# Patient Record
Sex: Male | Born: 1960
Health system: Southern US, Community
[De-identification: ages and names within clinical notes are randomized; demographics above are authoritative.]

## PROBLEM LIST (undated history)

## (undated) DIAGNOSIS — K219 Gastro-esophageal reflux disease without esophagitis: Secondary | ICD-10-CM

## (undated) DIAGNOSIS — E559 Vitamin D deficiency, unspecified: Secondary | ICD-10-CM

## (undated) DIAGNOSIS — I10 Essential (primary) hypertension: Secondary | ICD-10-CM

## (undated) DIAGNOSIS — E785 Hyperlipidemia, unspecified: Secondary | ICD-10-CM

## (undated) DIAGNOSIS — C9230 Myeloid sarcoma, not having achieved remission: Secondary | ICD-10-CM

## (undated) DIAGNOSIS — G4733 Obstructive sleep apnea (adult) (pediatric): Secondary | ICD-10-CM

## (undated) HISTORY — DX: Myeloid sarcoma, not having achieved remission: C92.30

## (undated) HISTORY — DX: Hyperlipidemia, unspecified: E78.5

## (undated) HISTORY — DX: Essential (primary) hypertension: I10

## (undated) HISTORY — DX: Vitamin D deficiency, unspecified: E55.9

## (undated) HISTORY — DX: Obstructive sleep apnea (adult) (pediatric): G47.33

## (undated) HISTORY — DX: Gastro-esophageal reflux disease without esophagitis: K21.9

---

## 1998-10-03 ENCOUNTER — Ambulatory Visit (HOSPITAL_BASED_OUTPATIENT_CLINIC_OR_DEPARTMENT_OTHER): Admission: RE | Admit: 1998-10-03 | Discharge: 1998-10-03 | Payer: Self-pay | Admitting: Surgery

## 1998-10-17 ENCOUNTER — Ambulatory Visit (HOSPITAL_BASED_OUTPATIENT_CLINIC_OR_DEPARTMENT_OTHER): Admission: RE | Admit: 1998-10-17 | Discharge: 1998-10-17 | Payer: Self-pay | Admitting: Surgery

## 1998-12-08 ENCOUNTER — Ambulatory Visit (HOSPITAL_COMMUNITY): Admission: RE | Admit: 1998-12-08 | Discharge: 1998-12-08 | Payer: Self-pay | Admitting: *Deleted

## 2007-09-07 HISTORY — PX: BACK SURGERY: SHX140

## 2008-08-09 DIAGNOSIS — C44599 Other specified malignant neoplasm of skin of other part of trunk: Secondary | ICD-10-CM | POA: Insufficient documentation

## 2011-02-11 ENCOUNTER — Other Ambulatory Visit (HOSPITAL_BASED_OUTPATIENT_CLINIC_OR_DEPARTMENT_OTHER): Payer: Self-pay | Admitting: Internal Medicine

## 2011-02-11 DIAGNOSIS — M549 Dorsalgia, unspecified: Secondary | ICD-10-CM

## 2011-02-15 ENCOUNTER — Ambulatory Visit (HOSPITAL_BASED_OUTPATIENT_CLINIC_OR_DEPARTMENT_OTHER)
Admission: RE | Admit: 2011-02-15 | Discharge: 2011-02-15 | Disposition: A | Discharge status: Home / Self Care | Payer: Medicaid Other | Source: Ambulatory Visit | Attending: Internal Medicine | Admitting: Internal Medicine

## 2011-02-15 ENCOUNTER — Ambulatory Visit (INDEPENDENT_AMBULATORY_CARE_PROVIDER_SITE_OTHER)
Admission: RE | Admit: 2011-02-15 | Discharge: 2011-02-15 | Disposition: A | Discharge status: Home / Self Care | Payer: Medicaid Other | Source: Ambulatory Visit | Attending: Internal Medicine | Admitting: Internal Medicine

## 2011-02-15 ENCOUNTER — Other Ambulatory Visit (HOSPITAL_BASED_OUTPATIENT_CLINIC_OR_DEPARTMENT_OTHER): Payer: Self-pay | Admitting: Internal Medicine

## 2011-02-15 DIAGNOSIS — M549 Dorsalgia, unspecified: Secondary | ICD-10-CM

## 2011-02-15 DIAGNOSIS — M47814 Spondylosis without myelopathy or radiculopathy, thoracic region: Secondary | ICD-10-CM

## 2011-02-15 DIAGNOSIS — Z859 Personal history of malignant neoplasm, unspecified: Secondary | ICD-10-CM | POA: Insufficient documentation

## 2011-02-24 ENCOUNTER — Encounter: Payer: Self-pay | Admitting: *Deleted

## 2011-02-25 ENCOUNTER — Ambulatory Visit (INDEPENDENT_AMBULATORY_CARE_PROVIDER_SITE_OTHER): Payer: Medicaid Other | Admitting: Pulmonary Disease

## 2011-02-25 ENCOUNTER — Encounter: Payer: Self-pay | Admitting: Pulmonary Disease

## 2011-02-25 VITALS — BP 142/88 | HR 70 | Temp 98.2°F | Ht 69.0 in | Wt 235.2 lb

## 2011-02-25 DIAGNOSIS — G4733 Obstructive sleep apnea (adult) (pediatric): Secondary | ICD-10-CM

## 2011-02-25 NOTE — Progress Notes (Signed)
  Subjective:    Patient ID: James Montgomery, male    DOB: 04/23/1961, 50 y.o.   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  HENT: Negative for nosebleeds, congestion and sneezing.   Eyes: Negative for redness.  Respiratory: Negative for cough, shortness of breath and wheezing.   Cardiovascular: Negative for chest pain and palpitations.  Gastrointestinal: Negative for nausea and abdominal pain.  Genitourinary: Negative for dysuria.  Musculoskeletal: Negative for joint swelling.  Skin: Negative for rash.  Neurological: Negative for headaches.       Objective:   Physical Exam Gen. Pleasant, well-nourished, in no distress, normal affect ENT - no lesions, no post nasal drip, class 2 airway Neck: No JVD, no thyromegaly, no carotid bruits Lungs: no use of accessory muscles, no dullness to percussion, clear without rales or rhonchi  Cardiovascular: Rhythm regular, heart sounds  normal, no murmurs or gallops, no peripheral edema Abdomen: soft and non-tender, no hepatosplenomegaly, BS normal. Musculoskeletal: No deformities, no cyanosis or clubbing Neuro:  alert, non focal         Assessment & Plan:

## 2011-02-25 NOTE — Patient Instructions (Signed)
We discussed signs of obstructive sleep apnea Schedule sleep study

## 2011-02-25 NOTE — Assessment & Plan Note (Signed)
Given excessive daytime somnolence, narrow pharyngeal exam, witnessed apneas & loud snoring, obstructive sleep apnea is very likely & an overnight polysomnogram will be scheduled as a split study. The pathophysiology of obstructive sleep apnea , it's cardiovascular consequences & modes of treatment including CPAP were discused with the patient in detail & they evidenced understanding.  

## 2011-03-08 ENCOUNTER — Ambulatory Visit (HOSPITAL_BASED_OUTPATIENT_CLINIC_OR_DEPARTMENT_OTHER): Payer: Medicaid Other | Attending: Pulmonary Disease

## 2011-03-08 DIAGNOSIS — R5381 Other malaise: Secondary | ICD-10-CM | POA: Insufficient documentation

## 2011-03-08 DIAGNOSIS — R0609 Other forms of dyspnea: Secondary | ICD-10-CM | POA: Insufficient documentation

## 2011-03-08 DIAGNOSIS — R404 Transient alteration of awareness: Secondary | ICD-10-CM | POA: Insufficient documentation

## 2011-03-08 DIAGNOSIS — R0989 Other specified symptoms and signs involving the circulatory and respiratory systems: Secondary | ICD-10-CM | POA: Insufficient documentation

## 2011-03-08 DIAGNOSIS — G4733 Obstructive sleep apnea (adult) (pediatric): Secondary | ICD-10-CM | POA: Insufficient documentation

## 2011-03-08 DIAGNOSIS — R5383 Other fatigue: Secondary | ICD-10-CM | POA: Insufficient documentation

## 2011-03-11 ENCOUNTER — Telehealth: Payer: Self-pay | Admitting: Pulmonary Disease

## 2011-03-11 NOTE — Telephone Encounter (Signed)
Takes 7-10 days for results - will  know by next week

## 2011-03-11 NOTE — Telephone Encounter (Signed)
Pt advised. Emilygrace Grothe, CMA  

## 2011-03-11 NOTE — Telephone Encounter (Signed)
Called and spoke with pt. Pt is requesting sleep study results.  Had sleep study done on Monday 03/08/11.  RA, please advise.  Thanks.

## 2011-03-23 ENCOUNTER — Telehealth: Payer: Self-pay | Admitting: Pulmonary Disease

## 2011-03-23 DIAGNOSIS — R5383 Other fatigue: Secondary | ICD-10-CM

## 2011-03-23 DIAGNOSIS — G4733 Obstructive sleep apnea (adult) (pediatric): Secondary | ICD-10-CM

## 2011-03-23 DIAGNOSIS — R5381 Other malaise: Secondary | ICD-10-CM

## 2011-03-23 DIAGNOSIS — R0609 Other forms of dyspnea: Secondary | ICD-10-CM

## 2011-03-23 DIAGNOSIS — R0989 Other specified symptoms and signs involving the circulatory and respiratory systems: Secondary | ICD-10-CM

## 2011-03-23 DIAGNOSIS — R404 Transient alteration of awareness: Secondary | ICD-10-CM

## 2011-03-23 NOTE — Telephone Encounter (Signed)
lmomtcb x1 

## 2011-03-23 NOTE — Telephone Encounter (Signed)
PL let him know PSG showed moderate obstructive sleep apnea - stopped breathing about 30 times/ hour. CPAP improved these events. Rx has been sent (CPAP 10 cm with large fullf ace mask, humidity, download in 1 month)

## 2011-03-24 NOTE — Procedures (Signed)
NAMENOSSON, WENDER                  ACCOUNT NO.:  [id number]  MEDICAL RECORD NO.:  [id number]          PATIENT TYPE:  OUT  LOCATION:  SLEEP CENTER                 FACILITY:  [facility]  PHYSICIAN:  Oretha Milch, MD      DATE OF BIRTH:  10-Mar-1961  DATE OF STUDY:  03/08/2011                           NOCTURNAL POLYSOMNOGRAM  REFERRING PHYSICIAN:  RAKESH V. ALVA  INDICATION FOR STUDY:  This is loud snoring, excessive daytime fatigue, trouble concentrating, and excessive somnolence in this 50 year old gentleman.  At the time of the study, he weighed 230 pounds with a height of 5 feet 9 inches, BMI of 34, neck size of 17 inches.  EPWORTH SLEEPINESS SCORE:  8.  MEDICATIONS:  Bedtime medications none.  This intervention polysomnogram was performed with sleep technologist in attendance.  EEG, EOG, EMG, EKG, and limb movements were recorded. Sleep stages arousals, limb movements and respiratory data were scored according to criteria laid out by the American Academy of Sleep Medicine.  SLEEP ARCHITECTURE:  Lights out was at 11:08 p.m.  Lights on was at 5:11 a.m.  CPAP was initiated at 1:47 a.m.  Total sleep time was 121 minutes with a sleep period time of 141 minutes and sleep efficiency of 76%. Sleep latency was 18 minutes.  Latency to REM sleep was 104 minutes, and week after sleep onset was 22 minutes.  Sleep stages of the percentage of total sleep time was N1 10%, N2 73%, N3 0%, and REM sleep 16.5% (20 minutes).  Supine sleep accounted for 16 minutes and during the CPAP portion, REM sleep accounted for 36 minutes and this was while laying supine.  Longest period of REM sleep was on CPAP at 4 a.m.  RESPIRATORY DATA:  During the diagnostic portion, there were 20 obstructive apneas, 6 central apneas, 0 mixed apneas, and 19 hypopneas with an apnea-hypopnea index of 22 events per hour, 15 RERAS were noted with an RDI of 30 events per hour.  The lowest saturation was 81%. Due to this degree  of respiratory disturbance, CPAP was initiated at 5 cm and titrated to one level of 10 cm.  At this level for 130 minutes including 27 minutes of REM sleep, no events or desaturations were noted.  This appears to be the optimal level used during the study.  AROUSAL DATA:  During the diagnostic portion, there were 52 arousals with an index of 26 events per hour, of these 32 were spontaneous and the rest were associated with respiratory events, very few arousals were noted during the titration portion.  LIMB MOVEMENT DATA:  During diagnostic portion, the lumbar arousal index was one events per hour.  No limb movements were noted on CPAP.  OXYGEN DATA:  Oxygen saturation data, the lowest desaturation during diagnostic portion was 81% and the desaturation index of 18 events per hour.  No significant desaturations were noted on the optimal level of CPAP.  DISCUSSION:  He was desensitized with a large full-face mask and reported being well rested after the study.  CPAP titration appears optimal with supine REM sleep noted.  CARDIAC DATA:  The low heart rate was 47 beats per minute,  the high heart rate was 93 beats per minute.  No arrhythmias were noted.  MOVEMENT-PARASOMNIA:  none noted  IMPRESSIONS-RECOMMENDATIONS: 1. Moderate obstructive sleep apnea with hypopneas causing sleep     fragmentation and oxygen desaturation. 2. This was corrected by continuous positive airway pressure of 10 cm     of the full face mask. 3. No evidence of cardiac arrhythmias, limb movements, or behavioral     disturbance during sleep.  RECOMMENDATIONS: 1. The treatment options for this degree of sleep disordered breathing     include weight loss and CPAP therapy.  CPAP can be initiated at 10     cm with a medium full-face mask and compliance should be monitored     at this level. 2. He should be cautioned against medications with sedative side     effects. 3. He should be advised against driving when  sleepy.     Oretha Milch, MD Electronically Signed    RVA/MEDQ  D:  03/23/2011 12:26:43  T:  03/24/2011 01:21:16  Job:  [id number]

## 2011-04-02 NOTE — Telephone Encounter (Signed)
I informed pt of RA's findings and recommendations. Pt verbalized understanding  

## 2011-04-29 ENCOUNTER — Telehealth: Payer: Self-pay | Admitting: Pulmonary Disease

## 2011-04-29 DIAGNOSIS — G4733 Obstructive sleep apnea (adult) (pediatric): Secondary | ICD-10-CM

## 2011-04-29 NOTE — Telephone Encounter (Signed)
downlaod on 10 cm  - poor compliance - he needs to increase to at least 6h every night CPAP doing its job of cuting out events

## 2011-05-05 NOTE — Telephone Encounter (Signed)
Pt informed and c/o mask is uncomfortable and has adjusted same. I advised pt that RA is out of the office this week and he states he is okay with waiting until he returns for his recs.

## 2011-05-12 NOTE — Telephone Encounter (Signed)
Pl have him d/w DME about changing mask - we will write rx as needed

## 2011-05-17 ENCOUNTER — Encounter: Payer: Self-pay | Admitting: Pulmonary Disease

## 2011-05-18 NOTE — Telephone Encounter (Signed)
Order placed to [facility]. 

## 2011-06-02 ENCOUNTER — Telehealth: Payer: Self-pay | Admitting: Pulmonary Disease

## 2011-06-02 NOTE — Telephone Encounter (Signed)
Compliance better (avg 4h) but he is still not using several nights ! Please use every night CPAP very effective when used

## 2011-06-08 ENCOUNTER — Encounter: Payer: Self-pay | Admitting: Pulmonary Disease

## 2011-06-08 NOTE — Telephone Encounter (Signed)
lmomtcb x 1. James Montgomery W. Darrah Dredge, CMA  

## 2011-06-08 NOTE — Telephone Encounter (Signed)
Returning Jessica's call can be reached at [phone].Raylene Everts

## 2011-06-08 NOTE — Telephone Encounter (Signed)
lmomtcb x 2  

## 2011-07-04 ENCOUNTER — Telehealth: Payer: Self-pay | Admitting: Pulmonary Disease

## 2011-07-04 DIAGNOSIS — G4733 Obstructive sleep apnea (adult) (pediatric): Secondary | ICD-10-CM

## 2011-07-04 NOTE — Telephone Encounter (Signed)
Download reviewed 10/12 >> his usage remains poor unfortnately if his insurance company decided to t ake his CPAP away, I will not be able to help him

## 2011-07-06 NOTE — Telephone Encounter (Signed)
lmomcb x 3.

## 2011-07-06 NOTE — Telephone Encounter (Signed)
Pl have him contact [phone] (sleep lab) & make appt for mask desensitization visit - no charge

## 2011-07-06 NOTE — Telephone Encounter (Signed)
lmomtcb 1

## 2011-07-06 NOTE — Telephone Encounter (Signed)
I informed pt of RA's findings and recommendations. Pt c/o mask is not comfortable and that is why he is not able to use machine every night. States mask "makes loud noise that wakes him up" and "does not get tight enough". Pt states he is trying to save money to afford new mask. Dr. Vassie Loll please advise. Thanks.

## 2011-07-06 NOTE — Telephone Encounter (Signed)
Called cell phone # and spoke with pt. I informed pt of RA's findings and recommendations. Pt verbalized understanding

## 2011-07-09 ENCOUNTER — Other Ambulatory Visit (HOSPITAL_BASED_OUTPATIENT_CLINIC_OR_DEPARTMENT_OTHER): Payer: Medicaid Other

## 2011-07-09 NOTE — Telephone Encounter (Signed)
Order placed. And pt aware of RA's recs.

## 2011-07-27 ENCOUNTER — Ambulatory Visit (HOSPITAL_BASED_OUTPATIENT_CLINIC_OR_DEPARTMENT_OTHER): Payer: Medicaid Other | Attending: Pulmonary Disease

## 2011-07-27 DIAGNOSIS — G4733 Obstructive sleep apnea (adult) (pediatric): Secondary | ICD-10-CM

## 2012-11-16 ENCOUNTER — Other Ambulatory Visit: Payer: Self-pay | Admitting: Gastroenterology

## 2015-03-06 ENCOUNTER — Encounter: Payer: Self-pay | Admitting: Pulmonary Disease

## 2015-03-06 ENCOUNTER — Ambulatory Visit (INDEPENDENT_AMBULATORY_CARE_PROVIDER_SITE_OTHER): Payer: 59 | Admitting: Pulmonary Disease

## 2015-03-06 ENCOUNTER — Encounter (INDEPENDENT_AMBULATORY_CARE_PROVIDER_SITE_OTHER): Payer: Self-pay

## 2015-03-06 VITALS — BP 142/70 | HR 82 | Ht 70.0 in | Wt 245.0 lb

## 2015-03-06 DIAGNOSIS — G4733 Obstructive sleep apnea (adult) (pediatric): Secondary | ICD-10-CM | POA: Diagnosis not present

## 2015-03-06 NOTE — Progress Notes (Signed)
Chief Complaint  Patient presents with  . 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 Epworth Score: 13    History of Present Illness: James Montgomery is a 54 y.o. male for evaluation of sleep problems.  He was previously seen by Dr. Elsworth Soho.  He was found to have moderate sleep apnea on sleep study in 2012.  He was not able to tolerate CPAP mask, and has not been on therapy since then.  His PCP was concerned about his blood pressure control.  He also has more trouble with his sleep.  He snores, and has been told he stops breathing while asleep.  He wakes up in the middle of the night, and has a hard time falling back to sleep.  His mouth gets dry at night.  He goes to sleep at 1030 pm.  He falls asleep after 10.  He wakes up some times to use the bathroom.  He gets out of bed at 4 am to exercise.  He feels tired in the morning, and would stay in bed longer if he could.  He takes naps on weekends when he can.  He denies morning headache.  He does not use anything to help him fall sleep or stay awake.  He denies sleep walking, sleep talking, bruxism, or nightmares.  There is no history of restless legs.  He denies sleep hallucinations, sleep paralysis, or cataplexy.  The Epworth score is 13 out of 24.  Tests: PSG 03/08/11 >> AHI 22, SaO2 low 81%  Ryson Bacha  has a past medical history of HTN (hypertension); Hyperlipidemia; Myeloid sarcoma; Vitamin D deficiency; GERD (gastroesophageal reflux disease); and OSA (obstructive sleep apnea).  Tommie Bohlken  has past surgical history that includes Back surgery (09/2007).  Prior to Admission medications   Medication Sig Start Date End Date Taking? Authorizing Provider  amlodipine-olmesartan (AZOR) 10-20 MG per tablet Take 1 tablet by mouth daily.     Yes Historical Provider, MD  cholecalciferol (VITAMIN D) 1000 UNITS tablet Take 1,000 Units by mouth daily.   Yes Historical Provider, MD  Cyanocobalamin (VITAMIN B 12 PO) Take by mouth once a week.     Yes  Historical Provider, MD  Multiple Vitamin (MULTIVITAMIN) tablet Take 1 tablet by mouth daily.     Yes Historical Provider, MD    No Known Allergies  His family history is not on file.  He  reports that he has never smoked. He has never used smokeless tobacco. He reports that he drinks alcohol. He reports that he does not use illicit drugs.  Review of Systems  Constitutional: Negative for fever and unexpected weight change.  HENT: Negative for congestion, dental problem, ear pain, nosebleeds, postnasal drip, rhinorrhea, sinus pressure, sneezing, sore throat and trouble swallowing.   Eyes: Negative for redness and itching.  Respiratory: Negative for cough, chest tightness, shortness of breath and wheezing.   Cardiovascular: Negative for palpitations and leg swelling.  Gastrointestinal: Negative for nausea and vomiting.       Acid heartburn  Genitourinary: Negative for dysuria.  Musculoskeletal: Negative for joint swelling.  Skin: Negative for rash.  Neurological: Negative for headaches.  Hematological: Does not bruise/bleed easily.  Psychiatric/Behavioral: Negative for dysphoric mood. The patient is not nervous/anxious.    Physical Exam: BP 142/70 mmHg  Pulse 82  Ht 5\' 10"  (1.778 m)  Wt 245 lb (111.131 kg)  BMI 35.15 kg/m2  SpO2 97%  General - No distress ENT - No sinus tenderness, no oral exudate, no  LAN, no thyromegaly, TM clear, pupils equal/reactive, MP 4 Cardiac - s1s2 regular, no murmur, pulses symmetric Chest - No wheeze/rales/dullness, good air entry, normal respiratory excursion Back - No focal tenderness Abd - Soft, non-tender, no organomegaly, + bowel sounds Ext - No edema Neuro - Normal strength, cranial nerves intact Skin - No rashes Psych - Normal mood, and behavior  Discussion: He has snoring, sleep disruption, witnessed apnea and daytime sleepiness.  He has hx of HTN on multiple medications, and prior hx of sleep apnea.  His BMI is > 35.  I am concerned he  could have sleep apnea.  We discussed how sleep apnea can affect various health problems including risks for hypertension, cardiovascular disease, and diabetes.  We also discussed how sleep disruption can increase risks for accident, such as while driving.  Weight loss as a means of improving sleep apnea was also reviewed.  Additional treatment options discussed were CPAP therapy, oral appliance, and surgical intervention.   Assessment/plan:  Obstructive sleep apnea. Plan: - will arrange for home sleep study pending insurance approval  Obesity. Plan: - discussed importance of weight loss   Chesley Mires, M.D. Pager [phone]

## 2015-03-06 NOTE — Progress Notes (Deleted)
   Subjective:    Patient ID: James Montgomery, male    DOB: 07-Feb-1961, 54 y.o.   MRN: [id number]  HPI    Review of Systems  Constitutional: Negative for fever and unexpected weight change.  HENT: Negative for congestion, dental problem, ear pain, nosebleeds, postnasal drip, rhinorrhea, sinus pressure, sneezing, sore throat and trouble swallowing.   Eyes: Negative for redness and itching.  Respiratory: Negative for cough, chest tightness, shortness of breath and wheezing.   Cardiovascular: Negative for palpitations and leg swelling.  Gastrointestinal: Negative for nausea and vomiting.       Acid heartburn  Genitourinary: Negative for dysuria.  Musculoskeletal: Negative for joint swelling.  Skin: Negative for rash.  Neurological: Negative for headaches.  Hematological: Does not bruise/bleed easily.  Psychiatric/Behavioral: Negative for dysphoric mood. The patient is not nervous/anxious.        Objective:   Physical Exam        Assessment & Plan:

## 2015-03-06 NOTE — Patient Instructions (Signed)
Will arrange for home sleep study Will call to arrange for follow up after sleep study reviewed  

## 2015-03-24 DIAGNOSIS — G4730 Sleep apnea, unspecified: Secondary | ICD-10-CM | POA: Diagnosis not present

## 2015-03-26 DIAGNOSIS — G4730 Sleep apnea, unspecified: Secondary | ICD-10-CM | POA: Diagnosis not present

## 2015-03-27 ENCOUNTER — Telehealth: Payer: Self-pay | Admitting: Pulmonary Disease

## 2015-03-27 DIAGNOSIS — G4733 Obstructive sleep apnea (adult) (pediatric): Secondary | ICD-10-CM

## 2015-03-27 NOTE — Telephone Encounter (Signed)
HST 03/24/15 >> AHI 18.5, SaO2 low is 76%.  Will have my nurse inform pt that sleep study shows moderate sleep apnea.  Options are 1) CPAP now, 2) ROV first.  If He is agreeable to CPAP, then please send order for auto CPAP range 5 to 15 cm H2O with heated humidity and mask of choice.  Have download sent 1 month after starting CPAP and set up ROV 2 months after starting CPAP.

## 2015-03-31 ENCOUNTER — Other Ambulatory Visit: Payer: Self-pay | Admitting: *Deleted

## 2015-03-31 DIAGNOSIS — G4733 Obstructive sleep apnea (adult) (pediatric): Secondary | ICD-10-CM

## 2015-03-31 NOTE — Telephone Encounter (Signed)
LMTCB x 1 with wife for pt to return call

## 2015-04-09 NOTE — Telephone Encounter (Signed)
Results have been explained to patient, pt expressed understanding. Order placed for CPAP start. Recall entered for 2 month ROV.  Nothing further needed.

## 2015-05-30 ENCOUNTER — Telehealth: Payer: Self-pay | Admitting: Pulmonary Disease

## 2015-05-30 NOTE — Telephone Encounter (Signed)
lmtcb x1 for pt. 

## 2015-06-02 NOTE — Telephone Encounter (Signed)
lmtcb

## 2015-06-02 NOTE — Telephone Encounter (Signed)
Spoke with pt. Pt has been scheduled to see TP on 06/09/15 at 12pm. Nothing further was needed.

## 2015-06-02 NOTE — Telephone Encounter (Signed)
[phone], pt cb

## 2015-06-02 NOTE — Telephone Encounter (Signed)
Spoke with pt, states he needs to see VS.  Pt was to follow up 2 mos after starting cpap.  VS has no openings until December. VS ok to double book, or can he follow up with TP?  Thanks!

## 2015-06-02 NOTE — Telephone Encounter (Signed)
lmtcb x2 for pt. 

## 2015-06-02 NOTE — Telephone Encounter (Signed)
Can schedule with Tammy Parrett. 

## 2015-06-09 ENCOUNTER — Ambulatory Visit (INDEPENDENT_AMBULATORY_CARE_PROVIDER_SITE_OTHER): Payer: 59 | Admitting: Adult Health

## 2015-06-09 ENCOUNTER — Encounter: Payer: Self-pay | Admitting: Adult Health

## 2015-06-09 VITALS — BP 158/98 | HR 62 | Temp 97.6°F | Ht 69.0 in | Wt 237.0 lb

## 2015-06-09 DIAGNOSIS — G4733 Obstructive sleep apnea (adult) (pediatric): Secondary | ICD-10-CM | POA: Diagnosis not present

## 2015-06-09 DIAGNOSIS — Z23 Encounter for immunization: Secondary | ICD-10-CM | POA: Diagnosis not present

## 2015-06-09 NOTE — Patient Instructions (Signed)
We are looking into why insurance is not covering CPAP .  Want you to begin CPAP At bedtime   Goal is at least 6hrs each night.  Work on weight loss.  Do not drive if sleepy Download in 1 month .  follow up Dr. Halford Chessman  Or Zephan Beauchaine in 2 months and As needed

## 2015-06-09 NOTE — Assessment & Plan Note (Addendum)
Moderate sleep apnea Will need to begin nocturnal C Pap  Plan We are looking into why insurance is not covering CPAP .  Want you to begin CPAP At bedtime   Goal is at least 6hrs each night.  Work on weight loss.  Do not drive if sleepy Download in 1 month .  follow up Dr. Halford Chessman  Or Ayaat Jansma in 2 months and As needed    Appeal sent to his insurance company Pt is aware

## 2015-06-09 NOTE — Progress Notes (Signed)
Reviewed and agree with assessment/plan. 

## 2015-06-09 NOTE — Progress Notes (Signed)
   Subjective:    Patient ID: James Montgomery, male    DOB: May 18, 1961, 54 y.o.   MRN: [id number]  HPI 54 yo male , obese male with sleep apnea  06/09/2015 Follow up : Sleep apnea Patient returns for a follow-up for sleep apnea. Patient was recently seen in the office for sleep issues. He been diagnosed with sleep apnea on a sleep study in 2012 but was unable to tolerate his C Pap mask. He was set up for a home sleep study on July 18 that showed an AHI 18.5, with SaO2 low at 76%. Patient was recommended to start on C Pap was in office visit. 2 months later. Unfortunately, his C Pap was not started. Patient is unsure why his insurance would not cover. We have contacted his insurance to do an inquiry to look into his coverage. Patient does complain of daytime sleepiness and disrupted sleep and loud snoring.  We talked with insurance and found out his insurance denied a new machine and we had to do an appeal to his insurance company . Pt is aware .    Review of Systems Constitutional:   No  weight loss, night sweats,  Fevers, chills, + fatigue, or  lassitude.  HEENT:   No headaches,  Difficulty swallowing,  Tooth/dental problems, or  Sore throat,                No sneezing, itching, ear ache, nasal congestion, post nasal drip,   CV:  No chest pain,  Orthopnea, PND, swelling in lower extremities, anasarca, dizziness, palpitations, syncope.   GI  No heartburn, indigestion, abdominal pain, nausea, vomiting, diarrhea, change in bowel habits, loss of appetite, bloody stools.   Resp: No shortness of breath with exertion or at rest.  No excess mucus, no productive cough,  No non-productive cough,  No coughing up of blood.  No change in color of mucus.  No wheezing.  No chest wall deformity  Skin: no rash or lesions.  GU: no dysuria, change in color of urine, no urgency or frequency.  No flank pain, no hematuria   MS:  No joint pain or swelling.  No decreased range of motion.  No back  pain.  Psych:  No change in mood or affect. No depression or anxiety.  No memory loss.         Objective:   Physical Exam GEN: A/Ox3; pleasant , NAD, obese  HEENT:  [place]/AT,  EACs-clear, TMs-wnl, NOSE-clear, THROAT-clear, no lesions, no postnasal drip or exudate noted. Class 2-3 airway  NECK:  Supple w/ fair ROM; no JVD; normal carotid impulses w/o bruits; no thyromegaly or nodules palpated; no lymphadenopathy.  RESP  Clear  P & A; w/o, wheezes/ rales/ or rhonchi.no accessory muscle use, no dullness to percussion  CARD:  RRR, no m/r/g  , no peripheral edema, pulses intact, no cyanosis or clubbing.  GI:   Soft & nt; nml bowel sounds; no organomegaly or masses detected.  Musco: Warm bil, no deformities or joint swelling noted.   Neuro: alert, no focal deficits noted.    Skin: Warm, no lesions or rashes         Assessment & Plan:

## 2015-06-16 ENCOUNTER — Telehealth: Payer: Self-pay | Admitting: Pulmonary Disease

## 2015-06-16 DIAGNOSIS — Z9989 Dependence on other enabling machines and devices: Secondary | ICD-10-CM

## 2015-06-16 DIAGNOSIS — G4733 Obstructive sleep apnea (adult) (pediatric): Secondary | ICD-10-CM

## 2015-06-16 NOTE — Telephone Encounter (Signed)
Called and spoke to [place]. Peer to peer is needed through [facility] to approve pt's CPAP. Pt was last seen by TP on 10.3.2016. Peer to Peer phone number: [phone].   Dr. Halford Chessman please advise. Thanks.

## 2015-06-18 NOTE — Telephone Encounter (Signed)
Dr. Halford Chessman, has this been done?  Please advise.

## 2015-06-18 NOTE — Telephone Encounter (Signed)
Patient Returned call  [phone]

## 2015-06-19 NOTE — Telephone Encounter (Signed)
Called # provided for peer to peer and spoke with Pam. Was advised this denial is from august and we are not able to do a peer to peer now. A letter of medical necessity will have to be done and faxed to [phone]. This letter will need to include reason for therapy being restarted, what happened to pt prior CPAP equipment as we requested a new machine (is it repairable or broken). We also need to submit clinical documentation including recent sleep study done. Please advise Dr. Halford Chessman thanks

## 2015-06-19 NOTE — Telephone Encounter (Signed)
I have tried calling for peer to peer >> left on hold endlessly.  Please call [employer] and set up date/time that I can actually speak with some one to do peer to peer.

## 2015-06-19 NOTE — Telephone Encounter (Signed)
atc pt X2, fast busy signal.  Wcb.  Dr. Halford Chessman please advise if peer-to-peer has been done.  Thanks!

## 2015-06-20 ENCOUNTER — Encounter: Payer: Self-pay | Admitting: Pulmonary Disease

## 2015-06-20 NOTE — Telephone Encounter (Signed)
Patient notified. Pt states he will call back to schedule appt after CPAP supplies/ CPAP has been set up Note faxed to pt's insurance co. Nothing further needed.

## 2015-06-20 NOTE — Telephone Encounter (Signed)
In review of records, Mr. James Montgomery was set up with CPAP in 2012.  He still has machine from 2012.  His main issue is with mask fit.  He is not sure if machine from 2012 is still working, but believes it will work.  I have advised him that I will send an order through [facility] to get him set up with new CPAP supplies, and will have his DME assess whether his CPAP device from 2012 is functional.  If it is not functional, will then contact his insurance to arrange for new CPAP machine.  I have written a letter that can be sent to his insurance detailing these findings.  This is on my desk in my office.  Will have my nurse schedule ROV in 2 months.

## 2015-08-12 ENCOUNTER — Ambulatory Visit: Payer: 59 | Admitting: Adult Health

## 2015-09-04 ENCOUNTER — Telehealth: Payer: Self-pay | Admitting: Pulmonary Disease

## 2015-09-04 NOTE — Telephone Encounter (Signed)
Patient Returned call  [phone]

## 2015-09-04 NOTE — Telephone Encounter (Signed)
Called and spoke with pt. He stated that he spoke with insurance and that he is eligible for a new CPAP machine and that he took his machine to [facility]. He states that [facility] told him that his machine was not functional. I explained to him that [facility] did not send a order for a new CPAP but only for supplies. VS also stated in the phone note from that if his machine was not functional then he would see about ordering a new machine. I explained to the patient that I would need to call [facility] to discuss if machine was functional. He voiced understanding and had no further questions.    Called and spoke with Lilia Pro. Pt called into [facility] on 08/07/15 and [facility] troubleshooted the CPAP and stated that the machine was functional. He stated at that time the physician had order new CPAP supplies. He then set up an appointment with [facility] for 08/13/15 at 10am and he no showed appointment. She also stated that he was not eligible for a new CPAP due to insurance.   Called and spoke with pt. Explained the above to the pt that the machine was functional and that he was not eligible for a new machine. He stated he would contact [facility] and discuss his CPAP with them. He voiced understanding and had no further questions.

## 2015-09-04 NOTE — Telephone Encounter (Signed)
ATC pt received fast busy signal. [facility]

## 2015-10-09 ENCOUNTER — Telehealth: Payer: Self-pay | Admitting: Pulmonary Disease

## 2015-10-09 DIAGNOSIS — G4733 Obstructive sleep apnea (adult) (pediatric): Secondary | ICD-10-CM

## 2015-10-09 NOTE — Telephone Encounter (Signed)
Patient says that he called [facility] and they told him that they have not received a prescription from our office for CPAP machine.  Order was sent from our office on 06/20/15:  Pt has used [facility] for CPAP supplies. He has CPAP from 2012 that he will start using again. Please have Gravette set this to auto CPAP with range of 5 to 15 cm H2O. Have download sent 1 month after restarting CPAP. Please have Freedom check to make sure CPAP device is working. He will also need new CPAP supplies.       Patient says that [facility] told him that they did not have an order from our office. Sent message to [place].  Awaiting response from [facility].

## 2015-10-09 NOTE — Telephone Encounter (Signed)
Needing asap say [facility] doesn't have anything on him please advise.Hillery Hunter

## 2015-10-10 NOTE — Telephone Encounter (Signed)
Pt returning call and I let him know that order was being placed and he seemed satisfied.James Montgomery

## 2015-10-10 NOTE — Telephone Encounter (Signed)
Spoke with Jeneen Rinks at [facility], states that the "itemization" on the order needs to state that pt needs the following; cpap machine, humidifier, full face mask, headgear, heated tubing, and filters.  Itemized order placed.   lmtcb X1 to make pt aware that order is being placed.

## 2015-10-10 NOTE — Telephone Encounter (Signed)
Pt calling again about these orders [facility] was on the line w/him [name] is saying the orders had expired and they are needing new order sent to the [facility] # [phone] ext [phone] The fax # is [phone] stating that the list has to be itemized listing each item.James Montgomery

## 2015-10-21 ENCOUNTER — Other Ambulatory Visit: Payer: Self-pay | Admitting: Gastroenterology

## 2015-10-21 DIAGNOSIS — R110 Nausea: Secondary | ICD-10-CM

## 2015-10-21 DIAGNOSIS — R1011 Right upper quadrant pain: Secondary | ICD-10-CM

## 2015-10-22 ENCOUNTER — Telehealth: Payer: Self-pay | Admitting: Pulmonary Disease

## 2015-10-22 ENCOUNTER — Ambulatory Visit (HOSPITAL_COMMUNITY)
Admission: RE | Admit: 2015-10-22 | Discharge: 2015-10-22 | Disposition: A | Discharge status: Home / Self Care | Payer: BLUE CROSS/BLUE SHIELD | Source: Ambulatory Visit | Attending: Gastroenterology | Admitting: Gastroenterology

## 2015-10-22 DIAGNOSIS — R1011 Right upper quadrant pain: Secondary | ICD-10-CM

## 2015-10-22 DIAGNOSIS — R935 Abnormal findings on diagnostic imaging of other abdominal regions, including retroperitoneum: Secondary | ICD-10-CM | POA: Diagnosis not present

## 2015-10-22 DIAGNOSIS — K7689 Other specified diseases of liver: Secondary | ICD-10-CM | POA: Insufficient documentation

## 2015-10-22 DIAGNOSIS — R109 Unspecified abdominal pain: Secondary | ICD-10-CM | POA: Diagnosis not present

## 2015-10-22 DIAGNOSIS — R110 Nausea: Secondary | ICD-10-CM

## 2015-10-22 DIAGNOSIS — D7389 Other diseases of spleen: Secondary | ICD-10-CM | POA: Diagnosis not present

## 2015-10-22 NOTE — Telephone Encounter (Signed)
Called and spoke with pt. Pt states that has not received his new cpap or supplies. I explained to him that the order was placed on 10/10/15 and that [facility] was closed at this time, but would call tomorrow morning to check on the status of the order. Pt voiced understanding and had no further questions. Will hold message in Triage

## 2015-10-23 NOTE — Telephone Encounter (Signed)
Called and spoke with Tasha with [facility]. She states that [facility] has received order from 10/10/15. She states she has [employer] and medicaid as his insurance companies and she explained that both were coming up inactive. I informed her that we have [employer] as his primary insurance. She verified that [employer] was active and states she is placing the order for the CPAP supplies and machine. I explained that I would contact the pt to inform him of insurance situation and order status.   Called and spoke with the patient. I informed him of the above. He stated that they never told him that they needed his current insurance and that he assumed that we updated it with [facility] for him. I explained that I updated it today with [facility] and that he should be receiving a call for [facility]. I did inform him if he has anymore issues with his CPAP to please call our office. He voiced understanding and had no further questions.  Nothing further needed.

## 2015-10-29 ENCOUNTER — Ambulatory Visit (HOSPITAL_COMMUNITY)
Admission: RE | Admit: 2015-10-29 | Discharge: 2015-10-29 | Disposition: A | Discharge status: Home / Self Care | Payer: BLUE CROSS/BLUE SHIELD | Source: Ambulatory Visit | Attending: Gastroenterology | Admitting: Gastroenterology

## 2015-10-29 DIAGNOSIS — R110 Nausea: Secondary | ICD-10-CM | POA: Diagnosis not present

## 2015-10-29 DIAGNOSIS — R1011 Right upper quadrant pain: Secondary | ICD-10-CM | POA: Insufficient documentation

## 2015-10-29 MED ORDER — SINCALIDE 5 MCG IJ SOLR: 2.0000 ug | Freq: Once | INTRAMUSCULAR | Status: DC

## 2015-10-29 MED ORDER — SINCALIDE 5 MCG IJ SOLR: 0.0200 ug/kg | Freq: Once | INTRAMUSCULAR | Status: DC

## 2015-10-29 MED ORDER — TECHNETIUM TC 99M MEBROFENIN IV KIT
5.5000 | PACK | Freq: Once | INTRAVENOUS | Status: AC | PRN
  Administered 2015-10-29: 5.5 via INTRAVENOUS

## 2015-11-12 ENCOUNTER — Encounter: Payer: Self-pay | Admitting: Internal Medicine

## 2015-11-12 ENCOUNTER — Ambulatory Visit: Payer: Self-pay | Admitting: General Surgery

## 2015-11-12 NOTE — H&P (Signed)
History of Present Illness Geoffery Spruce MD; 11/12/2015 3:09 PM) Patient words: New-gallbladder.  The patient is a 55 year old male who presents with non-malignant abdominal pain. Referred by Lavena Bullion, MD. He has had epigastric pain for most of the last year. He has had an extensive workup including empiric PPI, upper endoscopy, lower endoscopy although which were negative. On HIDA scan he had an EF of 10%. His pain does not seem to radiate. It is near constant. It is not associated with food. It is not worse with movement. It has occasionally been associated with nausea and a postprandial time.   Other Problems Malachi Bonds, CMA; 11/12/2015 2:10 PM) Gastroesophageal Reflux Disease High blood pressure  Diagnostic Studies History Malachi Bonds, CMA; 11/12/2015 2:10 PM) Colonoscopy 1-5 years ago  Allergies Malachi Bonds, CMA; 11/12/2015 2:11 PM) No Known Drug Allergies03/04/2016  Medication History Malachi Bonds, CMA; 11/12/2015 2:11 PM) Lisinopril-Hydrochlorothiazide (20-25MG  Tablet, Oral) Active. AmLODIPine Besylate (10MG  Tablet, Oral) Active. Medications Reconciled  Social History Malachi Bonds, CMA; 11/12/2015 2:10 PM) Alcohol use Occasional alcohol use. Caffeine use Tea. No drug use Tobacco use Never smoker.  Family History Malachi Bonds, CMA; 11/12/2015 2:10 PM) First Degree Relatives No pertinent family history    Review of Systems Malachi Bonds CMA; 11/12/2015 2:10 PM) General Not Present- Appetite Loss, Chills, Fatigue, Fever, Night Sweats, Weight Gain and Weight Loss. HEENT Not Present- Earache, Hearing Loss, Hoarseness, Nose Bleed, Oral Ulcers, Ringing in the Ears, Seasonal Allergies, Sinus Pain, Sore Throat, Visual Disturbances, Wears glasses/contact lenses and Yellow Eyes. Respiratory Not Present- Bloody sputum, Chronic Cough, Difficulty Breathing, Snoring and Wheezing. Breast Not Present- Breast Mass, Breast Pain, Nipple Discharge and Skin  Changes. Cardiovascular Not Present- Chest Pain, Difficulty Breathing Lying Down, Leg Cramps, Palpitations, Rapid Heart Rate, Shortness of Breath and Swelling of Extremities. Gastrointestinal Present- Abdominal Pain. Not Present- Bloating, Bloody Stool, Change in Bowel Habits, Chronic diarrhea, Constipation, Difficulty Swallowing, Excessive gas, Gets full quickly at meals, Hemorrhoids, Indigestion, Nausea, Rectal Pain and Vomiting. Male Genitourinary Not Present- Blood in Urine, Change in Urinary Stream, Frequency, Impotence, Nocturia, Painful Urination, Urgency and Urine Leakage. Musculoskeletal Not Present- Back Pain, Joint Pain, Joint Stiffness, Muscle Pain, Muscle Weakness and Swelling of Extremities. Neurological Not Present- Decreased Memory, Fainting, Headaches, Numbness, Seizures, Tingling, Tremor, Trouble walking and Weakness. Psychiatric Not Present- Anxiety, Bipolar, Change in Sleep Pattern, Depression, Fearful and Frequent crying. Endocrine Not Present- Cold Intolerance, Excessive Hunger, Hair Changes, Heat Intolerance, Hot flashes and New Diabetes. Hematology Not Present- Easy Bruising, Excessive bleeding, Gland problems, HIV and Persistent Infections.  Vitals (Chemira Jones CMA; 11/12/2015 2:10 PM) 11/12/2015 2:10 PM Weight: 237.7 lb Height: 69in Body Surface Area: 2.22 m Body Mass Index: 35.1 kg/m  Temp.: 98.53F(Oral)  Pulse: 62 (Regular)  BP: 118/78 (Sitting, Left Arm, Standard)       Physical Exam Geoffery Spruce MD; 11/12/2015 2:28 PM) General Mental Status-Alert. General Appearance-Cooperative. Orientation-Oriented X4. Posture-Normal posture.  Integumentary Global Assessment Normal Exam - Head/Face: no rashes, ulcers, lesions or evidence of photo damage. No palpable nodules or masses and Neck: no visible lesions or palpable masses.  Head and Neck Head-normocephalic, atraumatic with no lesions or palpable masses. Face Global Assessment -  atraumatic. Thyroid Gland Characteristics - normal size and consistency.  Eye Eyeball - Bilateral-Extraocular movements intact. Sclera/Conjunctiva - Bilateral-No scleral icterus, No Discharge.  ENMT Nose and Sinuses Nose - no deformities observed, no swelling present.  Chest and Lung Exam Palpation Normal exam - Non-tender. Auscultation Breath sounds -  Normal.  Cardiovascular Auscultation Rhythm - Regular. Heart Sounds - S1 WNL and S2 WNL. Carotid arteries - No Carotid bruit.  Abdomen Inspection Normal Exam - No Visible peristalsis, No Abnormal pulsations and No Paradoxical movements. Palpation/Percussion Normal exam - Soft, No Rebound tenderness, No Rigidity (guarding), No hepatosplenomegaly and No Palpable abdominal masses. Tenderness - Note: minimal tenderness on exam in epigastrium. Gallbladder - Negative Murphy's sign.  Peripheral Vascular Upper Extremity Palpation - Pulses bilaterally normal. Lower Extremity Palpation - Edema - Bilateral - No edema.  Neurologic Neurologic evaluation reveals -normal sensation and normal coordination.  Neuropsychiatric Mental status exam performed with findings of-able to articulate well with normal speech/language, rate, volume and coherence and thought content normal with ability to perform basic computations and apply abstract reasoning.  Musculoskeletal Normal Exam - Bilateral-Upper Extremity Strength Normal and Lower Extremity Strength Normal.    Assessment & Plan Geoffery Spruce MD; 11/12/2015 3:08 PM) BILIARY DYSKINESIA (K82.8) Impression: 55 yo male with epigastric pain and workup consistent with biliary dyskinesia. We discussed the etiology of gallbladder pain and the spectrum of gallbladder disease. I think given his symptoms, his only positive result being the HIDA scan with low EF, that this pain is likely related to his gallbladder and has a good likelihood to benefit from cholecystectomy. We discussed the  procedure in detail, that is performed with general anesthesia, 4 small incisions allow access to the gallbladder, that it is removed from the liver and common bile duct/cystic duct. We discussed risks of CBD injury, cystic duct leak, liver injury, bleeding, infection, need for open procedure, and post cholecystectomy syndrome. He showed good understanding and wanted to proceed. Current Plans The anatomy & physiology of hepatobiliary & pancreatic function was discussed. The pathophysiology of gallbladder dysfunction was discussed. Natural history risks without surgery was discussed. I feel the risks of no intervention will lead to serious problems that outweigh the operative risks; therefore, I recommended cholecystectomy to remove the pathology. I explained laparoscopic techniques with possible need for an open approach. Probable cholangiogram to evaluate the bilary tract was explained as well.  Risks such as bleeding, infection, abscess, leak, injury to other organs, need for further treatment, heart attack, death, and other risks were discussed. I noted a good likelihood this will help address the problem. Possibility that this will not correct all abdominal symptoms was explained. Goals of post-operative recovery were discussed as well. We will work to minimize complications. An educational handout further explaining the pathology and treatment options was given as well. Questions were answered. The patient expresses understanding & wishes to proceed with surgery.  Pt Education - Laparoscopic Cholecystectomy: gallbladder Pt Education - Pamphlet Given - Laparoscopic Gallbladder Surgery: discussed with patient and provided information. You are being scheduled for surgery - Our schedulers will call you.  You should hear from our office's scheduling department within 5 working days about the location, date, and time of surgery. We try to make accommodations for patient's preferences in scheduling surgery,  but sometimes the OR schedule or the surgeon's schedule prevents [place] from making those accommodations.  If you have not heard from our office [phone]) in 5 working days, call the office and ask for your surgeon's nurse.  If you have other questions about your diagnosis, plan, or surgery, call the office and ask for your surgeon's nurse.

## 2015-11-21 NOTE — Patient Instructions (Addendum)
James Montgomery  11/21/2015   Your procedure is scheduled on: 12-01-15  Report to [facility] Main  Entrance take [facility]  elevators to 3rd floor to  [place] at 715 AM.  Call this number if you have problems the morning of surgery [phone]   Remember: ONLY 1 PERSON MAY GO WITH YOU TO SHORT STAY TO GET  READY MORNING OF [place].   Do not eat food or drink liquids :After Midnight.   PLEASE BRING YOUR CPAP MASK AND TUBING WITH YOU THE MORNING OF SURGERY!   Take these medicines the morning of surgery with A SIP OF WATER: Amlodipine                                You may not have any metal on your body including hair pins and              piercings  Do not wear jewelry, make-up, lotions, powders or perfumes, deodorant             Do not wear nail polish.  Do not shave  48 hours prior to surgery.              Men may shave face and neck.   Do not bring valuables to the hospital. [place].  Contacts, dentures or bridgework may not be worn into surgery.  Leave suitcase in the car. After surgery it may be brought to your room.     Patients discharged the day of surgery will not be allowed to drive home.  Name and phone number of your driver:  Special Instructions: N/A              Please read over the following fact sheets you were given: _____________________________________________________________________             [facility] - Preparing for Surgery Before surgery, you can play an important role.  Because skin is not sterile, your skin needs to be as free of germs as possible.  You can reduce the number of germs on your skin by washing with CHG (chlorahexidine gluconate) soap before surgery.  CHG is an antiseptic cleaner which kills germs and bonds with the skin to continue killing germs even after washing. Please DO NOT use if you have an allergy to CHG or antibacterial soaps.  If your skin  becomes reddened/irritated stop using the CHG and inform your nurse when you arrive at Short Stay. Do not shave (including legs and underarms) for at least 48 hours prior to the first CHG shower.  You may shave your face/neck. Please follow these instructions carefully:  1.  Shower with CHG Soap the night before surgery and the  morning of Surgery.  2.  If you choose to wash your hair, wash your hair first as usual with your  normal  shampoo.  3.  After you shampoo, rinse your hair and body thoroughly to remove the  shampoo.                           4.  Use CHG as you would any other liquid soap.  You can apply chg directly  to the skin  and wash                       Gently with a scrungie or clean washcloth.  5.  Apply the CHG Soap to your body ONLY FROM THE NECK DOWN.   Do not use on face/ open                           Wound or open sores. Avoid contact with eyes, ears mouth and genitals (private parts).                       Wash face,  Genitals (private parts) with your normal soap.             6.  Wash thoroughly, paying special attention to the area where your surgery  will be performed.  7.  Thoroughly rinse your body with warm water from the neck down.  8.  DO NOT shower/wash with your normal soap after using and rinsing off  the CHG Soap.                9.  Pat yourself dry with a clean towel.            10.  Wear clean pajamas.            11.  Place clean sheets on your bed the night of your first shower and do not  sleep with pets. Day of Surgery : Do not apply any lotions/deodorants the morning of surgery.  Please wear clean clothes to the hospital/surgery center.  FAILURE TO FOLLOW THESE INSTRUCTIONS MAY RESULT IN THE CANCELLATION OF YOUR SURGERY PATIENT SIGNATURE_________________________________  NURSE SIGNATURE__________________________________  ________________________________________________________________________

## 2015-11-24 ENCOUNTER — Encounter (HOSPITAL_COMMUNITY)
Admission: RE | Admit: 2015-11-24 | Discharge: 2015-11-24 | Disposition: A | Discharge status: Home / Self Care | Payer: BLUE CROSS/BLUE SHIELD | Source: Ambulatory Visit | Attending: General Surgery | Admitting: General Surgery

## 2015-11-24 ENCOUNTER — Ambulatory Visit (HOSPITAL_COMMUNITY)
Admission: RE | Admit: 2015-11-24 | Discharge: 2015-11-24 | Disposition: A | Discharge status: Home / Self Care | Payer: BLUE CROSS/BLUE SHIELD | Source: Ambulatory Visit | Attending: Anesthesiology | Admitting: Anesthesiology

## 2015-11-24 ENCOUNTER — Encounter (HOSPITAL_COMMUNITY): Payer: Self-pay

## 2015-11-24 DIAGNOSIS — I10 Essential (primary) hypertension: Secondary | ICD-10-CM | POA: Insufficient documentation

## 2015-11-24 DIAGNOSIS — R05 Cough: Secondary | ICD-10-CM | POA: Diagnosis present

## 2015-11-24 DIAGNOSIS — R059 Cough, unspecified: Secondary | ICD-10-CM

## 2015-11-24 LAB — COMPREHENSIVE METABOLIC PANEL
ALBUMIN: 4.2 g/dL (ref 3.5–5.0)
ALT: 20 U/L (ref 17–63)
ANION GAP: 11 (ref 5–15)
AST: 18 U/L (ref 15–41)
Alkaline Phosphatase: 62 U/L (ref 38–126)
BUN: 10 mg/dL (ref 6–20)
CHLORIDE: 104 mmol/L (ref 101–111)
CO2: 27 mmol/L (ref 22–32)
Calcium: 9.5 mg/dL (ref 8.9–10.3)
Creatinine, Ser: 1.06 mg/dL (ref 0.61–1.24)
GFR calc Af Amer: >60 mL/min (ref >60)
GFR calc non Af Amer: >60 mL/min (ref >60)
GLUCOSE: 84 mg/dL (ref 65–99)
POTASSIUM: 3.3 mmol/L — AB (ref 3.5–5.1)
SODIUM: 142 mmol/L (ref 135–145)
Total Bilirubin: 0.5 mg/dL (ref 0.3–1.2)
Total Protein: 7.9 g/dL (ref 6.5–8.1)

## 2015-11-24 LAB — CBC
HEMATOCRIT: 40.1 % (ref 39.0–52.0)
HEMOGLOBIN: 13.5 g/dL (ref 13.0–17.0)
MCH: 28.1 pg (ref 26.0–34.0)
MCHC: 33.7 g/dL (ref 30.0–36.0)
MCV: 83.5 fL (ref 78.0–100.0)
Platelets: 280 10*3/uL (ref 150–400)
RBC: 4.8 MIL/uL (ref 4.22–5.81)
RDW: 13.4 % (ref 11.5–15.5)
WBC: 6.4 10*3/uL (ref 4.0–10.5)

## 2015-12-01 ENCOUNTER — Encounter (HOSPITAL_COMMUNITY)
Admission: RE | Disposition: A | Discharge status: Home / Self Care | Payer: Self-pay | Source: Ambulatory Visit | Attending: General Surgery

## 2015-12-01 ENCOUNTER — Ambulatory Visit (HOSPITAL_COMMUNITY): Payer: BLUE CROSS/BLUE SHIELD | Admitting: Anesthesiology

## 2015-12-01 ENCOUNTER — Encounter (HOSPITAL_COMMUNITY): Payer: Self-pay | Admitting: *Deleted

## 2015-12-01 ENCOUNTER — Ambulatory Visit (HOSPITAL_COMMUNITY)
Admission: RE | Admit: 2015-12-01 | Discharge: 2015-12-01 | Disposition: A | Discharge status: Home / Self Care | Payer: BLUE CROSS/BLUE SHIELD | Source: Ambulatory Visit | Attending: General Surgery | Admitting: General Surgery

## 2015-12-01 DIAGNOSIS — G4730 Sleep apnea, unspecified: Secondary | ICD-10-CM | POA: Diagnosis not present

## 2015-12-01 DIAGNOSIS — K811 Chronic cholecystitis: Secondary | ICD-10-CM | POA: Diagnosis not present

## 2015-12-01 DIAGNOSIS — K828 Other specified diseases of gallbladder: Secondary | ICD-10-CM | POA: Diagnosis present

## 2015-12-01 HISTORY — PX: CHOLECYSTECTOMY: SHX55

## 2015-12-01 SURGERY — LAPAROSCOPIC CHOLECYSTECTOMY
Anesthesia: General | Site: Abdomen

## 2015-12-01 MED ORDER — SODIUM CHLORIDE 0.9 % IJ SOLN
INTRAMUSCULAR | Status: AC
  Filled 2015-12-01: qty 10

## 2015-12-01 MED ORDER — BUPIVACAINE-EPINEPHRINE 0.25% -1:200000 IJ SOLN
INTRAMUSCULAR | Status: DC | PRN
  Administered 2015-12-01: 30 mL

## 2015-12-01 MED ORDER — ONDANSETRON HCL 4 MG/2ML IJ SOLN
INTRAMUSCULAR | Status: AC
  Filled 2015-12-01: qty 2

## 2015-12-01 MED ORDER — LIDOCAINE HCL (CARDIAC) 20 MG/ML IV SOLN
INTRAVENOUS | Status: DC | PRN
  Administered 2015-12-01: 100 mg via INTRATRACHEAL

## 2015-12-01 MED ORDER — ROCURONIUM BROMIDE 100 MG/10ML IV SOLN
INTRAVENOUS | Status: DC | PRN
  Administered 2015-12-01: 40 mg via INTRAVENOUS
  Administered 2015-12-01: 3 mg via INTRAVENOUS

## 2015-12-01 MED ORDER — BUPIVACAINE-EPINEPHRINE (PF) 0.25% -1:200000 IJ SOLN
INTRAMUSCULAR | Status: AC
  Filled 2015-12-01: qty 30

## 2015-12-01 MED ORDER — PROPOFOL 10 MG/ML IV BOLUS
INTRAVENOUS | Status: AC
  Filled 2015-12-01: qty 20

## 2015-12-01 MED ORDER — PROPOFOL 10 MG/ML IV BOLUS
INTRAVENOUS | Status: DC | PRN
  Administered 2015-12-01: 200 mg via INTRAVENOUS

## 2015-12-01 MED ORDER — NEOSTIGMINE METHYLSULFATE 10 MG/10ML IV SOLN
INTRAVENOUS | Status: DC | PRN
  Administered 2015-12-01: 5 mg via INTRAVENOUS

## 2015-12-01 MED ORDER — EPHEDRINE SULFATE 50 MG/ML IJ SOLN
INTRAMUSCULAR | Status: DC | PRN
  Administered 2015-12-01: 15 mg via INTRAVENOUS

## 2015-12-01 MED ORDER — LACTATED RINGERS IV SOLN
INTRAVENOUS | Status: DC
  Administered 2015-12-01: 1000 mL via INTRAVENOUS
  Administered 2015-12-01: 09:00:00 via INTRAVENOUS

## 2015-12-01 MED ORDER — KETOROLAC TROMETHAMINE 30 MG/ML IJ SOLN: 30.0000 mg | Freq: Once | INTRAMUSCULAR | Status: DC

## 2015-12-01 MED ORDER — LIDOCAINE HCL (CARDIAC) 20 MG/ML IV SOLN
INTRAVENOUS | Status: AC
  Filled 2015-12-01: qty 5

## 2015-12-01 MED ORDER — HYDROMORPHONE HCL 1 MG/ML IJ SOLN: 0.2500 mg | INTRAMUSCULAR | Status: DC | PRN

## 2015-12-01 MED ORDER — OXYCODONE-ACETAMINOPHEN 5-325 MG PO TABS: 1.0000 | ORAL_TABLET | ORAL | Status: DC | PRN

## 2015-12-01 MED ORDER — MIDAZOLAM HCL 2 MG/2ML IJ SOLN
INTRAMUSCULAR | Status: AC
  Filled 2015-12-01: qty 2

## 2015-12-01 MED ORDER — EPHEDRINE SULFATE 50 MG/ML IJ SOLN
INTRAMUSCULAR | Status: AC
  Filled 2015-12-01: qty 1

## 2015-12-01 MED ORDER — CEFOTETAN DISODIUM-DEXTROSE 2-2.08 GM-% IV SOLR
2.0000 g | INTRAVENOUS | Status: AC
  Administered 2015-12-01: 2 g via INTRAVENOUS

## 2015-12-01 MED ORDER — KETOROLAC TROMETHAMINE 30 MG/ML IJ SOLN
INTRAMUSCULAR | Status: DC | PRN
  Administered 2015-12-01: 30 mg via INTRAVENOUS

## 2015-12-01 MED ORDER — PHENYLEPHRINE HCL 10 MG/ML IJ SOLN
INTRAMUSCULAR | Status: DC | PRN
  Administered 2015-12-01: 80 ug via INTRAVENOUS

## 2015-12-01 MED ORDER — GLYCOPYRROLATE 0.2 MG/ML IJ SOLN
INTRAMUSCULAR | Status: DC | PRN
  Administered 2015-12-01: 0.6 mg via INTRAVENOUS

## 2015-12-01 MED ORDER — SUCCINYLCHOLINE CHLORIDE 20 MG/ML IJ SOLN
INTRAMUSCULAR | Status: DC | PRN
  Administered 2015-12-01: 100 mg via INTRAVENOUS

## 2015-12-01 MED ORDER — CEFOTETAN DISODIUM-DEXTROSE 2-2.08 GM-% IV SOLR
INTRAVENOUS | Status: AC
  Filled 2015-12-01: qty 50

## 2015-12-01 MED ORDER — LACTATED RINGERS IR SOLN
Status: DC | PRN
  Administered 2015-12-01: 1000 mL

## 2015-12-01 MED ORDER — 0.9 % SODIUM CHLORIDE (POUR BTL) OPTIME
TOPICAL | Status: DC | PRN
  Administered 2015-12-01: 1000 mL

## 2015-12-01 MED ORDER — FENTANYL CITRATE (PF) 250 MCG/5ML IJ SOLN
INTRAMUSCULAR | Status: DC | PRN
  Administered 2015-12-01 (×2): 50 ug via INTRAVENOUS
  Administered 2015-12-01: 100 ug via INTRAVENOUS
  Administered 2015-12-01: 50 ug via INTRAVENOUS

## 2015-12-01 MED ORDER — ONDANSETRON HCL 4 MG/2ML IJ SOLN
INTRAMUSCULAR | Status: DC | PRN
  Administered 2015-12-01: 4 mg via INTRAVENOUS

## 2015-12-01 MED ORDER — ROCURONIUM BROMIDE 100 MG/10ML IV SOLN
INTRAVENOUS | Status: AC
  Filled 2015-12-01: qty 1

## 2015-12-01 MED ORDER — FENTANYL CITRATE (PF) 250 MCG/5ML IJ SOLN
INTRAMUSCULAR | Status: AC
  Filled 2015-12-01: qty 5

## 2015-12-01 MED ORDER — OXYCODONE-ACETAMINOPHEN 5-325 MG PO TABS
1.0000 | ORAL_TABLET | Freq: Once | ORAL | Status: AC
  Administered 2015-12-01: 1 via ORAL
  Filled 2015-12-01: qty 1

## 2015-12-01 MED ORDER — PROMETHAZINE HCL 25 MG/ML IJ SOLN: 6.2500 mg | INTRAMUSCULAR | Status: DC | PRN

## 2015-12-01 SURGICAL SUPPLY — 41 items
APPLIER CLIP ROT 10 11.4 M/L (STAPLE)
CABLE HIGH FREQUENCY MONO STRZ (ELECTRODE) ×2 IMPLANT
CATH CHOLANG 76X19 KUMAR (CATHETERS) IMPLANT
CHLORAPREP W/TINT 26ML (MISCELLANEOUS) ×2 IMPLANT
CLIP APPLIE ROT 10 11.4 M/L (STAPLE) IMPLANT
CLIP LIGATING HEM O LOK PURPLE (MISCELLANEOUS) ×4 IMPLANT
CLIP LIGATING HEMO LOK XL GOLD (MISCELLANEOUS) IMPLANT
COVER MAYO STAND STRL (DRAPES) IMPLANT
COVER SURGICAL LIGHT HANDLE (MISCELLANEOUS) ×2 IMPLANT
CUTTER FLEX LINEAR 45M (STAPLE) IMPLANT
DECANTER SPIKE VIAL GLASS SM (MISCELLANEOUS) ×2 IMPLANT
DEVICE PMI PUNCTURE CLOSURE (MISCELLANEOUS) ×2 IMPLANT
DRAIN CHANNEL 19F RND (DRAIN) IMPLANT
DRAPE C-ARM 42X120 X-RAY (DRAPES) IMPLANT
DRAPE LAPAROSCOPIC ABDOMINAL (DRAPES) ×2 IMPLANT
EVACUATOR SILICONE 100CC (DRAIN) IMPLANT
GLOVE BIOGEL PI IND STRL 7.0 (GLOVE) ×1 IMPLANT
GLOVE BIOGEL PI INDICATOR 7.0 (GLOVE) ×1
GLOVE SURG SS PI 7.0 STRL IVOR (GLOVE) ×2 IMPLANT
GOWN STRL REUS W/TWL LRG LVL3 (GOWN DISPOSABLE) ×2 IMPLANT
GOWN STRL REUS W/TWL XL LVL3 (GOWN DISPOSABLE) ×4 IMPLANT
KIT BASIN OR (CUSTOM PROCEDURE TRAY) ×2 IMPLANT
LIQUID BAND (GAUZE/BANDAGES/DRESSINGS) ×2 IMPLANT
PAD POSITIONING PINK XL (MISCELLANEOUS) IMPLANT
POUCH RETRIEVAL ECOSAC 10 (ENDOMECHANICALS) ×1 IMPLANT
POUCH RETRIEVAL ECOSAC 10MM (ENDOMECHANICALS) ×1
RELOAD 45 VASCULAR/THIN (ENDOMECHANICALS) IMPLANT
RELOAD STAPLE TA45 3.5 REG BLU (ENDOMECHANICALS) IMPLANT
SCISSORS LAP 5X35 DISP (ENDOMECHANICALS) ×2 IMPLANT
SET IRRIG TUBING LAPAROSCOPIC (IRRIGATION / IRRIGATOR) IMPLANT
SHEARS HARMONIC ACE PLUS 36CM (ENDOMECHANICALS) IMPLANT
SLEEVE XCEL OPT CAN 5 100 (ENDOMECHANICALS) ×4 IMPLANT
STOPCOCK 4 WAY LG BORE MALE ST (IV SETS) ×2 IMPLANT
SUT ETHILON 2 0 PS N (SUTURE) IMPLANT
SUT MNCRL AB 4-0 PS2 18 (SUTURE) ×2 IMPLANT
SUT VICRYL 0 ENDOLOOP (SUTURE) IMPLANT
TOWEL OR 17X26 10 PK STRL BLUE (TOWEL DISPOSABLE) ×2 IMPLANT
TOWEL OR NON WOVEN STRL DISP B (DISPOSABLE) IMPLANT
TRAY LAPAROSCOPIC (CUSTOM PROCEDURE TRAY) ×2 IMPLANT
TROCAR BLADELESS OPT 5 100 (ENDOMECHANICALS) ×2 IMPLANT
TROCAR XCEL 12X100 BLDLESS (ENDOMECHANICALS) ×2 IMPLANT

## 2015-12-01 NOTE — Transfer of Care (Signed)
Immediate Anesthesia Transfer of Care Note  Patient: James Montgomery  Procedure(s) Performed: Procedure(s): LAPAROSCOPIC CHOLECYSTECTOMY (N/A)  Patient Location: PACU  Anesthesia Type:General  Level of Consciousness: awake and alert   Airway & Oxygen Therapy: Patient Spontanous Breathing and Patient connected to face mask oxygen  Post-op Assessment: Report given to RN and Post -op Vital signs reviewed and stable  Post vital signs: Reviewed and stable  Last Vitals:  Filed Vitals:   12/01/15 0713  BP: 142/79  Pulse: 74  Temp: 37.1 C  Resp: 16    Complications: No apparent anesthesia complications

## 2015-12-01 NOTE — Op Note (Signed)
Preoperative diagnosis: biliary dyskinesia  Postoperative diagnosis: Same   Procedure: laparoscopic cholecystectomy  Surgeon: Gurney Maxin, M.D.  Asst: none  Anesthesia: Gen.   Indications for procedure: James Montgomery is a 55 y.o. male with symptoms of RUQ pain and Nausea consistent with gallbladder disease, Confirmed by HIDA.  Description of procedure: The patient was brought into the operative suite, placed supine. Anesthesia was administered with endotracheal tube. Patient was strapped in place and foot board was secured. All pressure points were offloaded by foam padding. The patient was prepped and draped in the usual sterile fashion.  A small incision was made to the right of the umbilicus. A 79mm trocar was inserted into the peritoneal cavity with optical entry. Pneumoperitoneum was applied with high flow low pressure. 2 50mm trocars were placed in the RUQ. A 58mm trocar was placed in the subxiphoid space. All trocars sites were first anesthesized with 0.25% marcaine with epinephrine in the subcutaneous and preperitoneal layers. Next the patient was placed in reverse trendelenberg.   There were multiple adhesions of the omentum to the abdominal wall and the liver was also adhesed to the abdominal wall. These adhesions were taken down sharply in the right upper quadrant in order to free the gallbladder and liver for appropriate retraction and to be able to visualize the infundibulum and cystic triangle.  The gallbladder was retracted cephalad and lateral. The peritoneum was reflected off the infundibulum working lateral to medial. The cystic duct and cystic artery were identified and further dissection revealed a critical view..  The cystic duct and cystic artery were doubly clipped and ligated.   The gallbladder was removed off the liver bed with cautery. The Gallbladder was placed in a specimen bag. The gallbladder fossa was irrigated and hemostasis was applied with cautery. The  gallbladder was removed via the 7mm trocar. No dilation of the fascia was made. The gallbladder did leak bile on extraction and a suction/irrigation device was used to was the abdomen. Pneumoperitoneum was removed, all trocar were removed. All incisions were closed with 4-0 monocryl subcuticular stitch. The patient woke from anesthesia and was brought to PACU in stable condition.  Findings: adhesions to RUQ abdominal wall, normal critical view  Specimen: gallbldadder  Blood loss: <66ml  Local anesthesia: 37ml 0.25% marcaine w epi  Complications: none  Gurney Maxin, M.D. General, Bariatric, & Minimally Invasive Surgery [facility]

## 2015-12-01 NOTE — Anesthesia Preprocedure Evaluation (Addendum)
Anesthesia Evaluation  Patient identified by MRN, date of birth, ID band Patient awake    Reviewed: Allergy & Precautions, NPO status , Patient's Chart, lab work & pertinent test results  Airway Mallampati: III  TM Distance: >3 FB Neck ROM: Full    Dental  (+) Dental Advisory Given   Pulmonary sleep apnea ,    breath sounds clear to auscultation       Cardiovascular hypertension, Pt. on medications  Rhythm:Regular Rate:Normal     Neuro/Psych negative neurological ROS     GI/Hepatic Neg liver ROS, GERD  ,  Endo/Other  negative endocrine ROS  Renal/GU negative Renal ROS     Musculoskeletal negative musculoskeletal ROS (+)   Abdominal   Peds  Hematology negative hematology ROS (+)   Anesthesia Other Findings   Reproductive/Obstetrics                            Lab Results  Component Value Date   WBC 6.4 11/24/2015   HGB 13.5 11/24/2015   HCT 40.1 11/24/2015   MCV 83.5 11/24/2015   PLT 280 11/24/2015   Lab Results  Component Value Date   CREATININE 1.06 11/24/2015   BUN 10 11/24/2015   NA 142 11/24/2015   K 3.3* 11/24/2015   CL 104 11/24/2015   CO2 27 11/24/2015    Anesthesia Physical Anesthesia Plan  ASA: II  Anesthesia Plan: General   Post-op Pain Management:    Induction: Intravenous  Airway Management Planned: Oral ETT  Additional Equipment:   Intra-op Plan:   Post-operative Plan: Extubation in OR  Informed Consent: I have reviewed the patients History and Physical, chart, labs and discussed the procedure including the risks, benefits and alternatives for the proposed anesthesia with the patient or authorized representative who has indicated his/her understanding and acceptance.   Dental advisory given  Plan Discussed with: CRNA  Anesthesia Plan Comments:         Anesthesia Quick Evaluation

## 2015-12-01 NOTE — Discharge Instructions (Addendum)
PLEASE INSTRUCT PATIENT AND FAMILY THAT PATIENT MUST WEAR SLEEP APNEA MASK AT NIGHT AND DURING NAPS ONCE DISCHARGED HOME.  DR Delora Fuel HAD A LONG DISCUSSION WITH THE PATIENT ABOUT WEARING THE MASK FOR THE NEXT 77 HOURS AT HOME.  PATIENT ASSURED DR Ola Spurr HE WOULD DO THIS.  HIS WIFE WOULD BE EDUCATED ABOUT THIS MATTER ALSO.

## 2015-12-01 NOTE — Interval H&P Note (Signed)
History and Physical Interval Note:  I have evaluated the patient and reviewed the recent history and physical. There are no changes to his current health presentation.   12/01/2015 8:57 AM  James Montgomery  has presented today for surgery, with the diagnosis of Biliary dyskinesia   The various methods of treatment have been discussed with the patient and family. After consideration of risks, benefits and other options for treatment, the patient has consented to  Procedure(s): LAPAROSCOPIC CHOLECYSTECTOMY (N/A) as a surgical intervention .  The patient's history has been reviewed, patient examined, no change in status, stable for surgery.  I have reviewed the patient's chart and labs.  Questions were answered to the patient's satisfaction.     Arta Bruce Kinsinger

## 2015-12-01 NOTE — H&P (View-Only) (Signed)
History of Present Illness Geoffery Spruce MD; 11/12/2015 3:09 PM) Patient words: New-gallbladder.  The patient is a 55 year old male who presents with non-malignant abdominal pain. Referred by Lavena Bullion, MD. He has had epigastric pain for most of the last year. He has had an extensive workup including empiric PPI, upper endoscopy, lower endoscopy although which were negative. On HIDA scan he had an EF of 10%. His pain does not seem to radiate. It is near constant. It is not associated with food. It is not worse with movement. It has occasionally been associated with nausea and a postprandial time.   Other Problems Malachi Bonds, CMA; 11/12/2015 2:10 PM) Gastroesophageal Reflux Disease High blood pressure  Diagnostic Studies History Malachi Bonds, CMA; 11/12/2015 2:10 PM) Colonoscopy 1-5 years ago  Allergies Malachi Bonds, CMA; 11/12/2015 2:11 PM) No Known Drug Allergies03/04/2016  Medication History Malachi Bonds, CMA; 11/12/2015 2:11 PM) Lisinopril-Hydrochlorothiazide (20-25MG  Tablet, Oral) Active. AmLODIPine Besylate (10MG  Tablet, Oral) Active. Medications Reconciled  Social History Malachi Bonds, CMA; 11/12/2015 2:10 PM) Alcohol use Occasional alcohol use. Caffeine use Tea. No drug use Tobacco use Never smoker.  Family History Malachi Bonds, CMA; 11/12/2015 2:10 PM) First Degree Relatives No pertinent family history    Review of Systems Malachi Bonds CMA; 11/12/2015 2:10 PM) General Not Present- Appetite Loss, Chills, Fatigue, Fever, Night Sweats, Weight Gain and Weight Loss. HEENT Not Present- Earache, Hearing Loss, Hoarseness, Nose Bleed, Oral Ulcers, Ringing in the Ears, Seasonal Allergies, Sinus Pain, Sore Throat, Visual Disturbances, Wears glasses/contact lenses and Yellow Eyes. Respiratory Not Present- Bloody sputum, Chronic Cough, Difficulty Breathing, Snoring and Wheezing. Breast Not Present- Breast Mass, Breast Pain, Nipple Discharge and Skin  Changes. Cardiovascular Not Present- Chest Pain, Difficulty Breathing Lying Down, Leg Cramps, Palpitations, Rapid Heart Rate, Shortness of Breath and Swelling of Extremities. Gastrointestinal Present- Abdominal Pain. Not Present- Bloating, Bloody Stool, Change in Bowel Habits, Chronic diarrhea, Constipation, Difficulty Swallowing, Excessive gas, Gets full quickly at meals, Hemorrhoids, Indigestion, Nausea, Rectal Pain and Vomiting. Male Genitourinary Not Present- Blood in Urine, Change in Urinary Stream, Frequency, Impotence, Nocturia, Painful Urination, Urgency and Urine Leakage. Musculoskeletal Not Present- Back Pain, Joint Pain, Joint Stiffness, Muscle Pain, Muscle Weakness and Swelling of Extremities. Neurological Not Present- Decreased Memory, Fainting, Headaches, Numbness, Seizures, Tingling, Tremor, Trouble walking and Weakness. Psychiatric Not Present- Anxiety, Bipolar, Change in Sleep Pattern, Depression, Fearful and Frequent crying. Endocrine Not Present- Cold Intolerance, Excessive Hunger, Hair Changes, Heat Intolerance, Hot flashes and New Diabetes. Hematology Not Present- Easy Bruising, Excessive bleeding, Gland problems, HIV and Persistent Infections.  Vitals (Chemira Jones CMA; 11/12/2015 2:10 PM) 11/12/2015 2:10 PM Weight: 237.7 lb Height: 69in Body Surface Area: 2.22 m Body Mass Index: 35.1 kg/m  Temp.: 98.7F(Oral)  Pulse: 62 (Regular)  BP: 118/78 (Sitting, Left Arm, Standard)       Physical Exam Geoffery Spruce MD; 11/12/2015 2:28 PM) General Mental Status-Alert. General Appearance-Cooperative. Orientation-Oriented X4. Posture-Normal posture.  Integumentary Global Assessment Normal Exam - Head/Face: no rashes, ulcers, lesions or evidence of photo damage. No palpable nodules or masses and Neck: no visible lesions or palpable masses.  Head and Neck Head-normocephalic, atraumatic with no lesions or palpable masses. Face Global Assessment -  atraumatic. Thyroid Gland Characteristics - normal size and consistency.  Eye Eyeball - Bilateral-Extraocular movements intact. Sclera/Conjunctiva - Bilateral-No scleral icterus, No Discharge.  ENMT Nose and Sinuses Nose - no deformities observed, no swelling present.  Chest and Lung Exam Palpation Normal exam - Non-tender. Auscultation Breath sounds -  Normal.  Cardiovascular Auscultation Rhythm - Regular. Heart Sounds - S1 WNL and S2 WNL. Carotid arteries - No Carotid bruit.  Abdomen Inspection Normal Exam - No Visible peristalsis, No Abnormal pulsations and No Paradoxical movements. Palpation/Percussion Normal exam - Soft, No Rebound tenderness, No Rigidity (guarding), No hepatosplenomegaly and No Palpable abdominal masses. Tenderness - Note: minimal tenderness on exam in epigastrium. Gallbladder - Negative Murphy's sign.  Peripheral Vascular Upper Extremity Palpation - Pulses bilaterally normal. Lower Extremity Palpation - Edema - Bilateral - No edema.  Neurologic Neurologic evaluation reveals -normal sensation and normal coordination.  Neuropsychiatric Mental status exam performed with findings of-able to articulate well with normal speech/language, rate, volume and coherence and thought content normal with ability to perform basic computations and apply abstract reasoning.  Musculoskeletal Normal Exam - Bilateral-Upper Extremity Strength Normal and Lower Extremity Strength Normal.    Assessment & Plan Geoffery Spruce MD; 11/12/2015 3:08 PM) BILIARY DYSKINESIA (K82.8) Impression: 55 yo male with epigastric pain and workup consistent with biliary dyskinesia. We discussed the etiology of gallbladder pain and the spectrum of gallbladder disease. I think given his symptoms, his only positive result being the HIDA scan with low EF, that this pain is likely related to his gallbladder and has a good likelihood to benefit from cholecystectomy. We discussed the  procedure in detail, that is performed with general anesthesia, 4 small incisions allow access to the gallbladder, that it is removed from the liver and common bile duct/cystic duct. We discussed risks of CBD injury, cystic duct leak, liver injury, bleeding, infection, need for open procedure, and post cholecystectomy syndrome. He showed good understanding and wanted to proceed. Current Plans The anatomy & physiology of hepatobiliary & pancreatic function was discussed. The pathophysiology of gallbladder dysfunction was discussed. Natural history risks without surgery was discussed. I feel the risks of no intervention will lead to serious problems that outweigh the operative risks; therefore, I recommended cholecystectomy to remove the pathology. I explained laparoscopic techniques with possible need for an open approach. Probable cholangiogram to evaluate the bilary tract was explained as well.  Risks such as bleeding, infection, abscess, leak, injury to other organs, need for further treatment, heart attack, death, and other risks were discussed. I noted a good likelihood this will help address the problem. Possibility that this will not correct all abdominal symptoms was explained. Goals of post-operative recovery were discussed as well. We will work to minimize complications. An educational handout further explaining the pathology and treatment options was given as well. Questions were answered. The patient expresses understanding & wishes to proceed with surgery.  Pt Education - Laparoscopic Cholecystectomy: gallbladder Pt Education - Pamphlet Given - Laparoscopic Gallbladder Surgery: discussed with patient and provided information. You are being scheduled for surgery - Our schedulers will call you.  You should hear from our office's scheduling department within 5 working days about the location, date, and time of surgery. We try to make accommodations for patient's preferences in scheduling surgery,  but sometimes the OR schedule or the surgeon's schedule prevents [place] from making those accommodations.  If you have not heard from our office [phone]) in 5 working days, call the office and ask for your surgeon's nurse.  If you have other questions about your diagnosis, plan, or surgery, call the office and ask for your surgeon's nurse.

## 2015-12-01 NOTE — Anesthesia Postprocedure Evaluation (Signed)
Anesthesia Post Note  Patient: James Montgomery  Procedure(s) Performed: Procedure(s) (LRB): LAPAROSCOPIC CHOLECYSTECTOMY (N/A)  Patient location during evaluation: PACU Anesthesia Type: General Level of consciousness: awake and alert Pain management: pain level controlled Vital Signs Assessment: post-procedure vital signs reviewed and stable Respiratory status: spontaneous breathing, nonlabored ventilation, respiratory function stable and patient connected to nasal cannula oxygen Cardiovascular status: blood pressure returned to baseline and stable Postop Assessment: no signs of nausea or vomiting Anesthetic complications: no    Last Vitals:  Filed Vitals:   12/01/15 1300 12/01/15 1323  BP: 112/73 119/72  Pulse: 68 73  Temp: 36.4 C 36.6 C  Resp: 11 16    Last Pain:  Filed Vitals:   12/01/15 1323  PainSc: 7                  Tiajuana Amass

## 2015-12-01 NOTE — Anesthesia Procedure Notes (Signed)
Procedure Name: Intubation Date/Time: 12/01/2015 9:33 AM Performed by: [place] Dashauna Heymann C Pre-anesthesia Checklist: Patient identified, Emergency Drugs available, Suction available, Patient being monitored and Timeout performed Patient Re-evaluated:Patient Re-evaluated prior to inductionOxygen Delivery Method: Circle system utilized Preoxygenation: Pre-oxygenation with 100% oxygen Intubation Type: IV induction Ventilation: Mask ventilation without difficulty Laryngoscope Size: Mac and 4 Grade View: Grade III Tube type: Oral Tube size: 7.5 mm Number of attempts: 1 Airway Equipment and Method: Stylet Placement Confirmation: ETT inserted through vocal cords under direct vision,  positive ETCO2,  CO2 detector and breath sounds checked- equal and bilateral Secured at: 22 cm Tube secured with: Tape Dental Injury: Teeth and Oropharynx as per pre-operative assessment  Comments: Large epiglottis

## 2015-12-18 ENCOUNTER — Telehealth: Payer: Self-pay | Admitting: Pulmonary Disease

## 2015-12-18 DIAGNOSIS — G4733 Obstructive sleep apnea (adult) (pediatric): Secondary | ICD-10-CM

## 2015-12-18 NOTE — Telephone Encounter (Signed)
Called and spoke to pt. Pt c/o headache and body aches when awaking from using his CPAP, this has been occurring for 2 months. Pt is requesting recs. Pt is not in [facility].   Dr. Halford Chessman please advise. Thanks.

## 2015-12-18 NOTE — Telephone Encounter (Signed)
Please get a copy of his CPAP download.  Will call him after this is reviewed.

## 2015-12-18 NOTE — Telephone Encounter (Signed)
Order placed for CPAP download be faxed ASAP.  Will hold in my box to follow up.

## 2015-12-23 NOTE — Telephone Encounter (Signed)
Ashtyn please advise if this has been received or if this needs to be re-requested.  Thanks!

## 2015-12-23 NOTE — Telephone Encounter (Signed)
Called [facility], requested CPAP D/L be faxed to our office. Will await fax

## 2015-12-24 NOTE — Telephone Encounter (Signed)
Download received and placed in Dr Juanetta Gosling look at.  Please advise Dr Halford Chessman. Thanks.

## 2015-12-25 NOTE — Telephone Encounter (Signed)
Called spoke with patient and discussed CPAP download results / recs as stated by VS below.  Pt voiced his understanding and reported that he has recently changed masks to nasal pillows and feels a great improvement.  Pt will continue to use the pillows and CPAP as directed.  Will sign and forward to VS just to make him aware about pt's mack change.

## 2015-12-25 NOTE — Telephone Encounter (Signed)
CPAP 10/29/15 to 12/21/15 >> used on 22 of 54 nights with average 4 hrs 53 min.  Average AHI 6.3 with CPAP 10 cm H2O.   Will have my nurse inform pt that CPAP is controlling sleep apnea.  He should use whenever he is asleep to get maximal benefit.

## 2015-12-26 NOTE — Telephone Encounter (Signed)
Noted change to nasal pillows.  Chesley Mires, MD [facility] Pulmonary[facility]Critical [facility] 12/26/2015, 10:20 AM Pager:  [phone]

## 2016-01-16 ENCOUNTER — Other Ambulatory Visit: Payer: Self-pay | Admitting: General Surgery

## 2016-01-16 DIAGNOSIS — K828 Other specified diseases of gallbladder: Secondary | ICD-10-CM

## 2016-01-23 ENCOUNTER — Ambulatory Visit
Admission: RE | Admit: 2016-01-23 | Discharge: 2016-01-23 | Disposition: A | Discharge status: Home / Self Care | Payer: BLUE CROSS/BLUE SHIELD | Source: Ambulatory Visit | Attending: General Surgery | Admitting: General Surgery

## 2016-01-23 DIAGNOSIS — K828 Other specified diseases of gallbladder: Secondary | ICD-10-CM

## 2016-01-23 MED ORDER — IOPAMIDOL (ISOVUE-300) INJECTION 61%
125.0000 mL | Freq: Once | INTRAVENOUS | Status: AC | PRN
  Administered 2016-01-23: 125 mL via INTRAVENOUS

## 2016-09-30 IMAGING — NM NM HEPATO W/GB/PHARM/[PERSON_NAME]
1 series · 12 of 12 positions shown · non-contrast
Comparison: 10/22/2015 ultrasound

CLINICAL DATA: Right upper quadrant pain and nausea for a few
months

EXAM:
NUCLEAR MEDICINE HEPATOBILIARY IMAGING WITH GALLBLADDER EF
TECHNIQUE: Sequential images of the abdomen were obtained [DATE] minutes
following intravenous administration of radiopharmaceutical. After
slow intravenous infusion of 2.0 micrograms Cholecystokinin,
gallbladder ejection fraction was determined.
RADIOPHARMACEUTICALS:  5.5 mCi 0c-QQm Choletec IV

[Series 1: hepato · 4.46mm/px · 2 acquisitions, 12 frames shown]
[im 1/2]
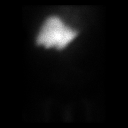
[im 1/2]
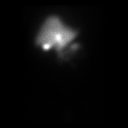
[im 1/2]
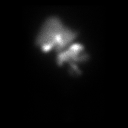
[im 1/2]
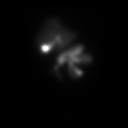
[im 1/2]
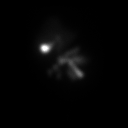
[im 1/2]
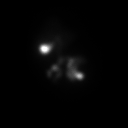
[im 2/2]
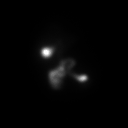
[im 2/2]
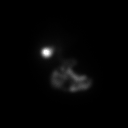
[im 2/2]
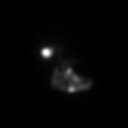
[im 2/2]
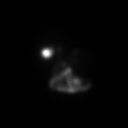
[im 2/2]
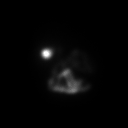
[im 2/2]
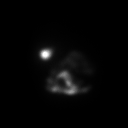

[12 of 12 positions shown; findings below may reference images not displayed]

FINDINGS: There is prompt uptake and excretion of radiotracer by the liver. No
evidence of cystic duct or common bile duct obstruction. Gallbladder
ejection fraction is 10%. At 60 min, normal ejection fraction is
greater than 40%.
IMPRESSION: No evidence of biliary obstruction. Abnormally low gallbladder
ejection fraction.

## 2017-06-14 ENCOUNTER — Ambulatory Visit: Payer: BLUE CROSS/BLUE SHIELD | Admitting: Pulmonary Disease

## 2017-11-03 ENCOUNTER — Ambulatory Visit: Payer: BLUE CROSS/BLUE SHIELD | Admitting: Acute Care

## 2017-11-03 NOTE — Progress Notes (Deleted)
History of Present Illness James Montgomery is a 57 y.o. male with OSA on CPAP. He is followed by Dr. Halford Chessman. Face mask: Nasal Pillows   11/03/2017  Pt. Presents for annula CPAP visit. He was last seen by Dr. Halford Chessman 06/2015.    Test Results: CPAP 10/29/15 to 12/21/15 >> used on 22 of 54 nights with average 4 hrs 53 min.  Average AHI 6.3 with CPAP 10 cm H2O.  CBC Latest Ref Rng & Units 11/24/2015  WBC 4.0 - 10.5 K/uL 6.4  Hemoglobin 13.0 - 17.0 g/dL 13.5  Hematocrit 39.0 - 52.0 % 40.1  Platelets 150 - 400 K/uL 280    BMP Latest Ref Rng & Units 11/24/2015  Glucose 65 - 99 mg/dL 84  BUN 6 - 20 mg/dL 10  Creatinine 0.61 - 1.24 mg/dL 1.06  Sodium 135 - 145 mmol/L 142  Potassium 3.5 - 5.1 mmol/L 3.3(L)  Chloride 101 - 111 mmol/L 104  CO2 22 - 32 mmol/L 27  Calcium 8.9 - 10.3 mg/dL 9.5    BNP No results found for: BNP  ProBNP No results found for: PROBNP  PFT No results found for: FEV1PRE, FEV1POST, FVCPRE, FVCPOST, TLC, DLCOUNC, PREFEV1FVCRT, PSTFEV1FVCRT  No results found.   Past medical hx Past Medical History:  Diagnosis Date  . GERD (gastroesophageal reflux disease)   . HTN (hypertension)   . Hyperlipidemia   . 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 OSA (obstructive sleep apnea)   . Vitamin D deficiency      Social History   Tobacco Use  . Smoking status: Never Smoker  . Smokeless tobacco: Never Used  Substance Use Topics  . Alcohol use: Yes    Alcohol/week: 0.0 oz    Comment: occasional wine  . Drug use: No    James Montgomery reports that  has never smoked. he has never used smokeless tobacco. He reports that he drinks alcohol. He reports that he does not use drugs.  Tobacco Cessation: Counseling given: Not Answered   Past surgical hx, Family hx, Social hx all reviewed.  Current Outpatient Medications on File Prior to Visit  Medication Sig  . amLODipine (NORVASC) 10 MG tablet Take 1 tablet by mouth daily.  . cholecalciferol (VITAMIN D) 1000 UNITS tablet Take 1,000 Units  by mouth daily.  . Cyanocobalamin (VITAMIN B 12 PO) Take 1 tablet by mouth daily.  Marland Kitchen dexlansoprazole (DEXILANT) 60 MG capsule Take 60 mg by mouth daily.  Marland Kitchen lisinopril-hydrochlorothiazide (PRINZIDE,ZESTORETIC) 20-25 MG tablet Take 1 tablet by mouth daily. Am  . oxyCODONE-acetaminophen (ROXICET) 5-325 MG tablet Take 1 tablet by mouth every 4 (four) hours as needed for severe pain.   No current facility-administered medications on file prior to visit.      Not on File  Review Of Systems:  Constitutional:   No  weight loss, night sweats,  Fevers, chills, fatigue, or  lassitude.  HEENT:   No headaches,  Difficulty swallowing,  Tooth/dental problems, or  Sore throat,                No sneezing, itching, ear ache, nasal congestion, post nasal drip,   CV:  No chest pain,  Orthopnea, PND, swelling in lower extremities, anasarca, dizziness, palpitations, syncope.   GI  No heartburn, indigestion, abdominal pain, nausea, vomiting, diarrhea, change in bowel habits, loss of appetite, bloody stools.   Resp: No shortness of breath with exertion or at rest.  No excess mucus, no productive cough,  No non-productive cough,  No coughing  up of blood.  No change in color of mucus.  No wheezing.  No chest wall deformity  Skin: no rash or lesions.  GU: no dysuria, change in color of urine, no urgency or frequency.  No flank pain, no hematuria   MS:  No joint pain or swelling.  No decreased range of motion.  No back pain.  Psych:  No change in mood or affect. No depression or anxiety.  No memory loss.   Vital Signs There were no vitals taken for this visit.   Physical Exam:  General- No distress,  A&Ox3 ENT: No sinus tenderness, TM clear, pale nasal mucosa, no oral exudate,no post nasal drip, no LAN Cardiac: S1, S2, regular rate and rhythm, no murmur Chest: No wheeze/ rales/ dullness; no accessory muscle use, no nasal flaring, no sternal retractions Abd.: Soft Non-tender Ext: No clubbing cyanosis,  edema Neuro:  normal strength Skin: No rashes, warm and dry Psych: normal mood and behavior   Assessment/Plan  No problem-specific Assessment & Plan notes found for this encounter.    Magdalen Spatz, NP 11/03/2017  8:24 AM

## 2017-11-08 NOTE — Progress Notes (Signed)
History of Present Illness James Montgomery is a 57 y.o. male never smoker with obesity and  OSA on CPAP. He is followed by James Montgomery.   11/09/2017 Follow up CPAP: Pt. Presents for follow up. He has not been in the office since 06/2015. He states he is here because his CPAP machine is broken. He states the top of the machine comes off. He also states his nasal pillows are broken and they do not work. The hose does not fit to the mask. He has called [facility] and they are not receptive to his needs. He would like to change companies. He states his machine is 56 years old and he would like a new one if possible.Due to the fact the machine is broken he does not use the machine often. He states this is due to the machine being broken. The down Load indicates poor use and AHI of 10. Based on download it appears he has not been wearing the device in a while.He states he does have daytime sleepiness. He states he is able to focus. He states he does not get sleepy when he drives.He states he goes to bed at 10 pm and does not sleep through the night. He sleeps usually from 10 pm - 4 am. He denies fever, chest pain,orthopnea or hemoptysis.  Test Results: Down Load 11/22/2015- 12/21/2015 S9 Elite Set pressure 10 cm H2O  usage days 7 out of 30 or 23% Greater than 4 hours 2 days or 7% Less than 4 hours 5 days or 17% Average usage 3 hours 17 minutes AHI 10.1 Poor compliance  CPAP 10/29/15 to 12/21/15 >> used on 22 of 54 nights with average 4 hrs 53 min.  Average AHI 6.3 with CPAP 10 cm H2O.  CBC Latest Ref Rng & Units 11/24/2015  WBC 4.0 - 10.5 K/uL 6.4  Hemoglobin 13.0 - 17.0 g/dL 13.5  Hematocrit 39.0 - 52.0 % 40.1  Platelets 150 - 400 K/uL 280    BMP Latest Ref Rng & Units 11/24/2015  Glucose 65 - 99 mg/dL 84  BUN 6 - 20 mg/dL 10  Creatinine 0.61 - 1.24 mg/dL 1.06  Sodium 135 - 145 mmol/L 142  Potassium 3.5 - 5.1 mmol/L 3.3(L)  Chloride 101 - 111 mmol/L 104  CO2 22 - 32 mmol/L 27  Calcium 8.9 - 10.3 mg/dL  9.5    BNP No results found for: BNP  ProBNP No results found for: PROBNP  PFT No results found for: FEV1PRE, FEV1POST, FVCPRE, FVCPOST, TLC, DLCOUNC, PREFEV1FVCRT, PSTFEV1FVCRT  No results found.   Past medical hx Past Medical History:  Diagnosis Date  . GERD (gastroesophageal reflux disease)   . HTN (hypertension)   . Hyperlipidemia   . 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 OSA (obstructive sleep apnea)   . Vitamin D deficiency      Social History   Tobacco Use  . Smoking status: Never Smoker  . Smokeless tobacco: Never Used  Substance Use Topics  . Alcohol use: Yes    Alcohol/week: 0.0 oz    Comment: occasional wine  . Drug use: No    James Montgomery reports that  has never smoked. he has never used smokeless tobacco. He reports that he drinks alcohol. He reports that he does not use drugs.  Tobacco Cessation: Never smoker  Past surgical hx, Family hx, Social hx all reviewed.  Current Outpatient Medications on File Prior to Visit  Medication Sig  . amLODipine (NORVASC) 10 MG tablet Take 1 tablet  by mouth daily.  Marland Kitchen lisinopril-hydrochlorothiazide (PRINZIDE,ZESTORETIC) 20-25 MG tablet Take 1 tablet by mouth daily. Am   No current facility-administered medications on file prior to visit.      Not on File  Review Of Systems:  Constitutional:   No  weight loss, night sweats,  Fevers, chills, fatigue, or  lassitude.  HEENT:   No headaches,  Difficulty swallowing,  Tooth/dental problems, or  Sore throat,                No sneezing, itching, ear ache, nasal congestion, post nasal drip,   CV:  No chest pain,  Orthopnea, PND, swelling in lower extremities, anasarca, dizziness, palpitations, syncope.   GI  No heartburn, indigestion, abdominal pain, nausea, vomiting, diarrhea, change in bowel habits, loss of appetite, bloody stools.   Resp: No shortness of breath with exertion or at rest.  No excess mucus, no productive cough,  No non-productive cough,  No coughing up of blood.   No change in color of mucus.  No wheezing.  No chest wall deformity  Skin: no rash or lesions.  GU: no dysuria, change in color of urine, no urgency or frequency.  No flank pain, no hematuria   MS:  No joint pain or swelling.  No decreased range of motion.  No back pain.  Psych:  No change in mood or affect. No depression or anxiety.  No memory loss.   Vital Signs BP 136/80 (BP Location: Left Arm, Cuff Size: Normal)   Pulse 69   Ht 5\' 9"  (1.753 m)   Wt 251 lb 12.8 oz (114.2 kg)   SpO2 98%   BMI 37.18 kg/m    Physical Exam:  General- No distress,  A&Ox3, pleasant ENT: No sinus tenderness, TM clear, pale nasal mucosa, no oral exudate,no post nasal drip, no LAN Cardiac: S1, S2, regular rate and rhythm, no murmur Chest: No wheeze/ rales/ dullness; no accessory muscle use, no nasal flaring, no sternal retractions Abd.: Soft Non-tender, nondistended, obese Ext: No clubbing cyanosis, edema Neuro:  normal strength, alert and oriented x3, moving all extremities x4 Skin: No rashes, warm and dry Psych: normal mood and behavior   Assessment/Plan  OSA (obstructive sleep apnea) Noncompliant States she is unable to wear machine because it is broken Requesting new machine so that he can be compliant with his therapy Plan We will order a new CPAP machine. We will order supplies and mask of choice. We will change your DME from [place] on CPAP at bedtime.  You need to increase your use, make sure you use your CPAP machine every night without fail Goal is to wear for at least 6 hours each night for maximal clinical benefit. Continue to work on weight loss, as the link between excess weight  and sleep apnea is well established.  Do not drive if sleepy. Remember to clean mask, tubing, filter, and reservoir once weekly with soapy water.  Follow up with James Montgomery , 30- 45  days after initiation of use Enroll in [employer] Please contact office for sooner follow up if  symptoms do not improve or worsen or seek emergency care       James Spatz, [place] 11/09/2017  8:49 PM

## 2017-11-09 ENCOUNTER — Telehealth: Payer: Self-pay | Admitting: Acute Care

## 2017-11-09 ENCOUNTER — Ambulatory Visit: Payer: BLUE CROSS/BLUE SHIELD | Admitting: Acute Care

## 2017-11-09 ENCOUNTER — Encounter: Payer: Self-pay | Admitting: Acute Care

## 2017-11-09 VITALS — BP 136/80 | HR 69 | Ht 69.0 in | Wt 251.8 lb

## 2017-11-09 DIAGNOSIS — G4733 Obstructive sleep apnea (adult) (pediatric): Secondary | ICD-10-CM

## 2017-11-09 NOTE — Patient Instructions (Signed)
It is good to see you today. We will order a new CPAP machine. We will order supplies and mask of choice. We will change your DME from [place] on CPAP at bedtime.  You need to increase your use, make sure you use your CPAP machine every night without fail Goal is to wear for at least 6 hours each night for maximal clinical benefit. Continue to work on weight loss, as the link between excess weight  and sleep apnea is well established.  Do not drive if sleepy. Remember to clean mask, tubing, filter, and reservoir once weekly with soapy water.  Follow up with Judson Roch NP , 30- 45  days after initiation of use Enroll in [employer] Please contact office for sooner follow up if symptoms do not improve or worsen or seek emergency care

## 2017-11-09 NOTE — Telephone Encounter (Signed)
Spoke with Judson Roch, patient is in [place] and we can get the download. Nothing further needed.

## 2017-11-09 NOTE — Assessment & Plan Note (Signed)
Noncompliant States she is unable to wear machine because it is broken Requesting new machine so that he can be compliant with his therapy Plan We will order a new CPAP machine. We will order supplies and mask of choice. We will change your DME from [facility] on CPAP at bedtime.  You need to increase your use, make sure you use your CPAP machine every night without fail Goal is to wear for at least 6 hours each night for maximal clinical benefit. Continue to work on weight loss, as the link between excess weight  and sleep apnea is well established.  Do not drive if sleepy. Remember to clean mask, tubing, filter, and reservoir once weekly with soapy water.  Follow up with Judson Roch NP , 30- 45  days after initiation of use Enroll in [employer] Please contact office for sooner follow up if symptoms do not improve or worsen or seek emergency care

## 2017-11-24 ENCOUNTER — Telehealth: Payer: Self-pay | Admitting: Acute Care

## 2017-11-24 NOTE — Telephone Encounter (Signed)
lmtcb x1 for pt. 

## 2017-11-24 NOTE — Telephone Encounter (Signed)
Pt is calling back [phone]

## 2017-11-24 NOTE — Telephone Encounter (Signed)
Pt is calling bck [phone]

## 2017-11-24 NOTE — Telephone Encounter (Signed)
Called patient unable to reach left message to give us a call back.

## 2017-11-25 NOTE — Telephone Encounter (Signed)
Pt has not been contacted or received cpap. Order placed for CPAP on 11/09/17. PCCs please advise on status of order.  Thanks.

## 2017-11-25 NOTE — Telephone Encounter (Signed)
Pt has been contacted by [facility] a new machine has been ordered and they try to reach him yesterday I tried to call him just now no answer he can call the at [phone] and they will be glad to help him Joellen [place]

## 2017-11-25 NOTE — Telephone Encounter (Signed)
LMTCB

## 2017-11-28 NOTE — Telephone Encounter (Signed)
lmtcb x2 for pt. 

## 2017-11-29 NOTE — Telephone Encounter (Signed)
Spoke with pt. He has spoken with [facility]. Nothing further is needed at this time.

## 2017-12-16 ENCOUNTER — Telehealth: Payer: Self-pay | Admitting: Pulmonary Disease

## 2017-12-16 DIAGNOSIS — G4733 Obstructive sleep apnea (adult) (pediatric): Secondary | ICD-10-CM

## 2017-12-16 NOTE — Telephone Encounter (Signed)
Pt is calling back [phone]

## 2017-12-16 NOTE — Telephone Encounter (Signed)
Spoke with patient. He wishes to proceed with the cpap titration and mask fit. Advised patient order had been sent today.   Nothing else needed at time of call.

## 2017-12-16 NOTE — Telephone Encounter (Addendum)
ATC pt, no answer. Left message for pt to call back.  I pulled download from [place] and had SG and VS review it. VS suggested he get a cpap titration and mask fir to figure out what pressure setting is best for him. I will order test one I speak with pt.

## 2018-01-09 ENCOUNTER — Ambulatory Visit (HOSPITAL_BASED_OUTPATIENT_CLINIC_OR_DEPARTMENT_OTHER): Payer: BLUE CROSS/BLUE SHIELD | Attending: Pulmonary Disease | Admitting: Pulmonary Disease

## 2018-01-09 VITALS — Ht 69.0 in | Wt 251.0 lb

## 2018-01-09 DIAGNOSIS — G4733 Obstructive sleep apnea (adult) (pediatric): Secondary | ICD-10-CM

## 2018-01-18 ENCOUNTER — Telehealth: Payer: Self-pay | Admitting: Pulmonary Disease

## 2018-01-18 DIAGNOSIS — G4733 Obstructive sleep apnea (adult) (pediatric): Secondary | ICD-10-CM | POA: Diagnosis not present

## 2018-01-18 NOTE — Telephone Encounter (Signed)
Left voice mail on machine for patient to return phone call back regarding results. X1  

## 2018-01-18 NOTE — Telephone Encounter (Signed)
CPAP 01/09/18 >> CPAP 13 cm H2O   Please let him know that he did well during sleep study with CPAP 13 cm H2O and was fitted with a small size Resmed nasal CPAP mask airfit N30i mask.  Please place order to change his CPAP setting and mask accordingly.

## 2018-01-18 NOTE — Procedures (Signed)
   Patient Name: James Montgomery, James Montgomery Date: 01/09/2018   Gender: Male  D.O.B: Aug 08, 1961  Age (years): 16  Referring Provider: Chesley Mires MD, ABSM  Height (inches): 89  Interpreting Physician: Chesley Mires MD, ABSM  Weight (lbs): 240  RPSGT: Jorge Ny  BMI: 35  MRN: [id number]  Neck Size: 18.00  CLINICAL INFORMATION  57 yo male with history of obstructive sleep apnea. He was having difficulty tolerating CPAP mask and pressure. He is referred to sleep lab for a CPAP titration. Sleep study from 03/08/11 showed AHI 22. SLEEP STUDY TECHNIQUE  As per the AASM Manual for the Scoring of Sleep and Associated Events v2.3 (April 2016) with a hypopnea requiring 4% desaturations. The channels recorded and monitored were frontal, central and occipital EEG, electrooculogram (EOG), submentalis EMG (chin), nasal and oral airflow, thoracic and abdominal wall motion, anterior tibialis EMG, snore microphone, electrocardiogram, and pulse oximetry. Continuous positive airway pressure (CPAP) was initiated at the beginning of the study and titrated to treat sleep-disordered breathing. MEDICATIONS  Medications self-administered by patient taken the night of the study : N/A TECHNICIAN COMMENTS  Comments added by technician: NONE Comments added by scorer: N/A  RESPIRATORY PARAMETERS  Optimal PAP Pressure (cm): 13 AHI at Optimal Pressure (/hr): 3.7  Overall Minimal O2 (%): 87.0 Supine % at Optimal Pressure (%): 100  Minimal O2 at Optimal Pressure (%): 89.0      SLEEP ARCHITECTURE  The study was initiated at 10:49:59 PM and ended at 5:13:14 AM. Sleep onset time was 3.0 minutes and the sleep efficiency was 65.8%%. The total sleep time was 252 minutes. The patient spent 6.2%% of the night in stage N1 sleep, 68.7%% in stage N2 sleep, 0.0%% in stage N3 and 25.20% in REM.Stage REM latency was 37.5 minutes Wake after sleep onset was 128.3. Alpha intrusion was absent. Supine sleep was 100.00%. CARDIAC DATA    The 2 lead EKG demonstrated sinus rhythm. The mean heart rate was 57.0 beats per minute. Other EKG findings include: None.  LEG MOVEMENT DATA  The total Periodic Limb Movements of Sleep (PLMS) were 0. The PLMS index was 0.0. A PLMS index of <15 is considered normal in adults. IMPRESSIONS  - The optimal PAP pressure was 13 cm of water. DIAGNOSIS  - Obstructive Sleep Apnea (327.23 [G47.33 ICD-10]) RECOMMENDATIONS  - Trial of CPAP therapy on 13 cm H2O with a Small size Resmed Nasal CPAP Mask AirFit N30i mask and heated humidification. [Electronically signed] 01/18/2018 11:56 AM Chesley Mires MD, ABSM  Diplomate, American Board of Sleep Medicine  NPI: [id number]

## 2018-01-20 NOTE — Telephone Encounter (Signed)
Called pt and advised message from the provider. Pt understood and verbalized understanding. Nothing further is needed.   Order placed to [facility].

## 2018-01-20 NOTE — Telephone Encounter (Signed)
Left voice mail on machine for patient to return phone call back regarding results. X2 

## 2018-01-20 NOTE — Telephone Encounter (Signed)
Pt is calling back [phone]

## 2018-02-21 ENCOUNTER — Telehealth: Payer: Self-pay | Admitting: Pulmonary Disease

## 2018-02-21 NOTE — Telephone Encounter (Signed)
Attempted to call pt. I did not receive an answer. I have left a message for pt to return our call.  

## 2018-02-22 NOTE — Progress Notes (Signed)
@Patient  ID: James Montgomery, male    DOB: [birth year], 57 y.o.   MRN: [id number]  Chief Complaint  Patient presents with  . Acute Visit    States after sleep study he is having tenderness and pain in BLE where electrodes were placed. Patient has taken tylenol and motrin and reports it has not been effective. Denies decreased mobility. Also reports they are very warm to touch.     Referring provider: Benito Mccreedy, MD  HPI:  James Montgomery is a 57 y.o. male never smoker with obesity and  OSA on CPAP. He is followed by Dr. Halford Chessman.  Recent [facility] Encounters:   11/09/2017 Follow up CPAP: Pt. Presents for follow up. He has not been in the office since 06/2015. He states he is here because his CPAP machine is broken. He states the top of the machine comes off. He also states his nasal pillows are broken and they do not work. The hose does not fit to the mask. He has called [facility] and they are not receptive to his needs. He would like to change companies. He states his machine is 56 years old and he would like a new one if possible.Due to the fact the machine is broken he does not use the machine often. He states this is due to the machine being broken. The down Load indicates poor use and AHI of 10. Based on download it appears he has not been wearing the device in a while.He states he does have daytime sleepiness. He states he is able to focus. He states he does not get sleepy when he drives.He states he goes to bed at 10 pm and does not sleep through the night. He sleeps usually from 10 pm - 4 am. He denies fever, chest pain,orthopnea or hemoptysis.    Tests:  03/24/15 - home sleep study - AHI 18.5, with SaO2 low at 76%. CPAP Titration  01/09/18 >> CPAP 13 cm H2O  Imaging:   Cardiac:   Labs:   Micro:   Chart Review:   02/23/18 Acute  Pleasant 58 year old patient seen in office today.  Patient reporting progressive off and on leg pain.  Patient reports he is been having this pain in  his legs bilaterally since he completed his CPAP titration study.  Patient reports that it is a sharp pain he feels that it is warm.  Patient reports that pain is intermittent but sharp, and comes and goes but has been present over the past couple of days so he wanted to let us know and be seen.  Patient denies any cramping pain.  Of note patient is adherent to his diuretic and antihypertensive medications.  CPAP compliance report reviewed in office today.  Showing 30 out of 30 days used.  With all 30 days being greater than 4 hours.  With average usage of 5 hours and 44 minutes.  With an AHI of 0.9.  Patient is reporting improved daytime fatigue, and reports that his symptoms are much better now that he is using his CPAP regularly.   No Known Allergies  Immunization History  Administered Date(s) Administered  . Influenza,inj,Quad PF,6+ Mos 06/09/2015    Past Medical History:  Diagnosis Date  . GERD (gastroesophageal reflux disease)   . HTN (hypertension)   . Hyperlipidemia   . 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 [place])   . OSA (obstructive sleep apnea)   . Vitamin D deficiency     Tobacco History: Social History   Tobacco Use  Smoking Status  Never Smoker  Smokeless Tobacco Never Used   Counseling given: Yes Continue not smoking  Outpatient Encounter Medications as of 02/23/2018  Medication Sig  . amLODipine (NORVASC) 10 MG tablet Take 1 tablet by mouth daily.  Marland Kitchen lisinopril-hydrochlorothiazide (PRINZIDE,ZESTORETIC) 20-25 MG tablet Take 1 tablet by mouth daily. Am   No facility-administered encounter medications on file as of 02/23/2018.     Review of Systems  Constitutional:   No  weight loss, night sweats,  fevers, chills, fatigue, or  lassitude HEENT:   No headaches,  Difficulty swallowing,  Tooth/dental problems, or  Sore throat, No sneezing, itching, ear ache, nasal congestion, post nasal drip  CV: No chest pain,  orthopnea, PND, swelling in lower extremities, anasarca, dizziness,  palpitations, syncope  GI: No heartburn, indigestion, abdominal pain, nausea, vomiting, diarrhea, change in bowel habits, loss of appetite, bloody stools Resp: No shortness of breath with exertion or at rest.  No excess mucus, no productive cough,  No non-productive cough,  No coughing up of blood.  No change in color of mucus.  No wheezing.  No chest wall deformity Skin: no rash, lesions, no skin changes. GU: no dysuria, change in color of urine, no urgency or frequency.  No flank pain, no hematuria  MS: +LE bilateral leg pain      No decreased range of motion.  No back pain. Psych:  No change in mood or affect. No depression or anxiety.  No memory loss.   Physical Exam  BP 134/90   Pulse 65   Temp 98 F (36.7 C) (Oral)   Ht 5\' 9"  (1.753 m)   Wt 258 lb 3.2 oz (117.1 kg)   SpO2 100%   BMI 38.13 kg/m   Wt Readings from Last 3 Encounters:  02/23/18 258 lb 3.2 oz (117.1 kg)  01/09/18 251 lb (113.9 kg)  11/09/17 251 lb 12.8 oz (114.2 kg)   >>>notified patient of weight increase    GEN: A/Ox3; pleasant , NAD, well nourished, on RA   HEENT:  [place]/AT,  EACs-clear, TMs-wnl, NOSE-clear, THROAT-clear, mallampati III  NECK:  Supple w/ fair ROM; no JVD;no lymphadenopathy.    RESP:  Clear  P & A; w/o, wheezes/ rales/ or rhonchi. no accessory muscle use, no dullness to percussion  CARD:  RRR, no m/r/g, no peripheral edema, radial and distal, pulses intact, no cyanosis or clubbing.  GI:   Soft & nt; nml bowel sounds; no organomegaly or masses detected.   Musco: Warm bil, no deformities or joint swelling noted.   Neuro: alert, no focal deficits noted.    Skin:  no lesions or rashes, no noticeable swelling, or issues at the site of where the probes were placed.  Skin is warm throughout, no temperature change at localized lower extremity site.    Lab Results:  CBC    Component Value Date/Time   WBC 6.4 11/24/2015 1440   RBC 4.80 11/24/2015 1440   HGB 13.5 11/24/2015 1440   HCT  40.1 11/24/2015 1440   PLT 280 11/24/2015 1440   MCV 83.5 11/24/2015 1440   MCH 28.1 11/24/2015 1440   MCHC 33.7 11/24/2015 1440   RDW 13.4 11/24/2015 1440    BMET    Component Value Date/Time   NA 139 02/23/2018 1011   K 3.1 (L) 02/23/2018 1011   CL 100 02/23/2018 1011   CO2 30 02/23/2018 1011   GLUCOSE 119 (H) 02/23/2018 1011   BUN 12 02/23/2018 1011   CREATININE 1.20 02/23/2018 1011  CALCIUM 9.2 02/23/2018 1011   GFRNONAA >60 11/24/2015 1440   GFRAA >60 11/24/2015 1440    BNP No results found for: BNP  ProBNP No results found for: PROBNP  Imaging: No results found.   Assessment & Plan:    Pleasant 57 year old patient seen office today.  Discussed leg pain with patient as I feel he is probably having hypokalemia.  Due to his diuretic as well as seen in 2017 labs.  We will do lab work today as well as d-dimer.  Due to length of symptoms as well as patient's description of the pain we will also proceed with venous Dopplers just to ensure there is no DVT.  Although as I explained to patient I do find that unlikely and do suspect that it is an electrolyte abnormality.  Patient to follow-up with primary care in 2 weeks.  OSA (obstructive sleep apnea) Keep up the great work using your CPAP TSH today Follow-up with our office in 6 months Notify our office if you are having any difficulties using your CPAP  Bilateral leg pain Intermittent bilateral leg pain  We will do lab work today to rule out any DVT, as well as electrolyte abnormalities We will do lower extremity venous Doppler       Lauraine Rinne, NP 02/23/2018

## 2018-02-22 NOTE — Telephone Encounter (Signed)
Spoke with pt, states that he is having b/l leg stinging/burning in calves and thighs since his last sleep study.  Pt states that electrodes were put on his legs when he was sleeping in sleep study, and has had discomfort at site of electrode placement since then.  Pain is worse after walking and when laying down for a while.  Denies any rash/redness on skin, states the pain is "deeper than his skin".  Denies any new med changes.  Requesting additional recs.    VS please advise on recs.  Thanks.

## 2018-02-22 NOTE — Telephone Encounter (Signed)
lmtcb x1 for.

## 2018-02-22 NOTE — Telephone Encounter (Signed)
Pt returned call. Acute apt made with [facility] for 6/20.

## 2018-02-22 NOTE — Telephone Encounter (Signed)
Appt made so I will close this encounter.

## 2018-02-22 NOTE — Telephone Encounter (Signed)
He needs to come in to have this looked at.

## 2018-02-23 ENCOUNTER — Encounter: Payer: Self-pay | Admitting: Pulmonary Disease

## 2018-02-23 ENCOUNTER — Ambulatory Visit (INDEPENDENT_AMBULATORY_CARE_PROVIDER_SITE_OTHER): Payer: BLUE CROSS/BLUE SHIELD | Admitting: Pulmonary Disease

## 2018-02-23 ENCOUNTER — Other Ambulatory Visit (INDEPENDENT_AMBULATORY_CARE_PROVIDER_SITE_OTHER): Payer: BLUE CROSS/BLUE SHIELD

## 2018-02-23 VITALS — BP 134/90 | HR 65 | Temp 98.0°F | Ht 69.0 in | Wt 258.2 lb

## 2018-02-23 DIAGNOSIS — G4733 Obstructive sleep apnea (adult) (pediatric): Secondary | ICD-10-CM | POA: Diagnosis not present

## 2018-02-23 DIAGNOSIS — M79604 Pain in right leg: Secondary | ICD-10-CM | POA: Diagnosis not present

## 2018-02-23 DIAGNOSIS — M79605 Pain in left leg: Secondary | ICD-10-CM

## 2018-02-23 LAB — BASIC METABOLIC PANEL
BUN: 12 mg/dL (ref 6–23)
CALCIUM: 9.2 mg/dL (ref 8.4–10.5)
CO2: 30 mEq/L (ref 19–32)
Chloride: 100 mEq/L (ref 96–112)
Creatinine, Ser: 1.2 mg/dL (ref 0.40–1.50)
GFR: 80.48 mL/min (ref >60.00)
Glucose, Bld: 119 mg/dL — ABNORMAL HIGH (ref 70–99)
Potassium: 3.1 mEq/L — ABNORMAL LOW (ref 3.5–5.1)
SODIUM: 139 meq/L (ref 135–145)

## 2018-02-23 LAB — D-DIMER, QUANTITATIVE (NOT AT ARMC): D DIMER QUANT: 0.43 ug{FEU}/mL (ref <0.50)

## 2018-02-23 LAB — TSH: TSH: 3.69 u[IU]/mL (ref 0.35–4.50)

## 2018-02-23 MED ORDER — POTASSIUM CHLORIDE ER 10 MEQ PO TBCR: 20.0000 meq | EXTENDED_RELEASE_TABLET | Freq: Every day | ORAL | 0 refills | Status: DC

## 2018-02-23 NOTE — Patient Instructions (Addendum)
Lab work today Lower extremity ultrasound of your legs to rule out any clot Continue follow-up with primary care in 2 weeks Continue CPAP use Work to increase your sleep times to a goal of 8 hours a night Follow-up 6 months to see Dr. Halford Chessman, or sooner if you have difficulties with your breathing or using your CPAP  . Keep up the hard work using your device.  . Do not drive or operate heavy machinery if tired or drowsy.  . Please notify the supply company and office if you are unable to use your device regularly due to missing supplies or machine being broken.  . Work on maintaining a healthy weight and following your recommended nutrition plan  . Maintain proper daily exercise and movement  . Maintaining proper use of your device can also help improve management of other chronic illnesses such as: Blood pressure, blood sugars, and weight management.     CPAP Cleaning:  Clean weekly, with Dawn soap, and bottle brush.  Set up to air dry.    Please contact the office if your symptoms worsen or you have concerns that you are not improving.   Thank you for choosing  [facility] for your healthcare, and for allowing [place] to partner with you on your healthcare journey. I am thankful to be able to provide care to you today.   Wyn Quaker FNP-C

## 2018-02-23 NOTE — Assessment & Plan Note (Addendum)
Intermittent bilateral leg pain  We will do lab work today to rule out any DVT, as well as electrolyte abnormalities We will do lower extremity venous Doppler

## 2018-02-23 NOTE — Assessment & Plan Note (Addendum)
Keep up the great work using your CPAP TSH today Follow-up with our office in 6 months Notify our office if you are having any difficulties using your CPAP

## 2018-02-23 NOTE — Progress Notes (Signed)
Lab results of come back.    Your thyroid function is normal.  Your blood work did show low potassium.  As we discussed we saw these on your labs 2 years ago and is not shocking given your leg pain/cramps as well as you being on a fluid pill.  I am going to write for you to take some potassium pills over the next 3 days.  I would like for you to let your primary care doctor know as well as have the lab work repeated when you see your primary care.  If you continue to have low potassium levels with your blood work as well as the leg pain/cramps that they may need to have you take potassium chronically.  I will have you take potassium over the next 3 days.  The instructions are to take two 10 mEq tablets (total of 20 mEq) daily for the next 3 days.  Take with food.  Repeat your lab work to check your potassium with your primary care at your office visit within 2 weeks.  Is very important that you continue to follow-up with primary care and notify them that your potassium was low.  Wyn Quaker FNP

## 2018-02-23 NOTE — Progress Notes (Signed)
Reviewed and agree with assessment/plan.   Malesha Suliman, MD [facility] 09/01/2016, 12:24 PM Pager:  [phone]  

## 2018-02-24 ENCOUNTER — Inpatient Hospital Stay (HOSPITAL_COMMUNITY): Admission: RE | Admit: 2018-02-24 | Payer: BLUE CROSS/BLUE SHIELD | Source: Ambulatory Visit

## 2018-02-28 ENCOUNTER — Ambulatory Visit (HOSPITAL_COMMUNITY)
Admission: RE | Admit: 2018-02-28 | Discharge: 2018-02-28 | Disposition: A | Discharge status: Home / Self Care | Payer: BLUE CROSS/BLUE SHIELD | Source: Ambulatory Visit | Attending: Cardiovascular Disease | Admitting: Cardiovascular Disease

## 2018-02-28 DIAGNOSIS — M79605 Pain in left leg: Secondary | ICD-10-CM | POA: Diagnosis not present

## 2018-02-28 DIAGNOSIS — M79604 Pain in right leg: Secondary | ICD-10-CM | POA: Diagnosis not present

## 2018-03-01 ENCOUNTER — Telehealth: Payer: Self-pay | Admitting: Pulmonary Disease

## 2018-03-01 ENCOUNTER — Other Ambulatory Visit: Payer: Self-pay | Admitting: Pulmonary Disease

## 2018-03-01 DIAGNOSIS — M79604 Pain in right leg: Secondary | ICD-10-CM

## 2018-03-01 DIAGNOSIS — M79605 Pain in left leg: Principal | ICD-10-CM

## 2018-03-01 MED ORDER — POTASSIUM CHLORIDE ER 10 MEQ PO TBCR: 20.0000 meq | EXTENDED_RELEASE_TABLET | Freq: Every day | ORAL | 0 refills | Status: AC

## 2018-03-01 NOTE — Telephone Encounter (Signed)
Called patient to make him aware of his venous study results and patient voiced that he went to the pharmacy to pick up his potassium that was called in for him and the medication was not there. Patient voiced that he needed the medication sent to [facility] pharmacy, original order was sent to [facility]. Potassium order sent to [facility] and I called pharmacy to confirm receipt. Nothing further needed at this time.

## 2018-03-01 NOTE — Progress Notes (Signed)
Your lower venous DVT study results have come back showing no DVT or clot in your legs.  No changes to your plan of care at this time.  Please make sure that you are following up with primary care regarding your potassium level as discussed at your appointment.  Wyn Quaker, FNP

## 2019-08-11 ENCOUNTER — Encounter (HOSPITAL_COMMUNITY): Payer: Self-pay | Admitting: Emergency Medicine

## 2019-08-11 ENCOUNTER — Emergency Department (HOSPITAL_COMMUNITY)
Admission: EM | Admit: 2019-08-11 | Discharge: 2019-08-12 | Disposition: A | Discharge status: Home / Self Care | Payer: BLUE CROSS/BLUE SHIELD | Attending: Emergency Medicine | Admitting: Emergency Medicine

## 2019-08-11 ENCOUNTER — Other Ambulatory Visit: Payer: Self-pay

## 2019-08-11 DIAGNOSIS — T464X5A Adverse effect of angiotensin-converting-enzyme inhibitors, initial encounter: Secondary | ICD-10-CM | POA: Diagnosis not present

## 2019-08-11 DIAGNOSIS — I10 Essential (primary) hypertension: Secondary | ICD-10-CM

## 2019-08-11 DIAGNOSIS — R600 Localized edema: Secondary | ICD-10-CM | POA: Diagnosis present

## 2019-08-11 DIAGNOSIS — Z79899 Other long term (current) drug therapy: Secondary | ICD-10-CM | POA: Diagnosis not present

## 2019-08-11 DIAGNOSIS — T783XXA Angioneurotic edema, initial encounter: Secondary | ICD-10-CM

## 2019-08-11 DIAGNOSIS — R030 Elevated blood-pressure reading, without diagnosis of hypertension: Secondary | ICD-10-CM | POA: Insufficient documentation

## 2019-08-11 NOTE — ED Triage Notes (Signed)
Pt. Took lisinopril 20 mg about 1000 am and upper lip started to swell about an hour ago (approx 2225). Denies pain, nausea/vomit.

## 2019-08-11 NOTE — ED Provider Notes (Signed)
[facility] EMER[facility]ENC[facility] DEPARTMENT Provider Note   CSN: [id number] Arrival date & time: 08/11/19  2323    History   Chief Complaint Chief Complaint  Patient presents with  . Allergic Reaction    swelling upper lip linsinopril    HPI James Montgomery is a 58 y.o. male.   The history is provided by the patient.  Allergic Reaction He has history of hypertension, hyperlipidemia and comes in because of swelling of the upper lip which started about 2 hours ago.  He denies any other swelling.  Denies any difficulty breathing or swallowing.  He denies any itching.  He does take lisinopril-hydrochlorothiazide for his blood pressure.  Also, several family members have tested positive for COVID-19.  He had a test done yesterday with results pending.  He has noted a change in his sense of taste but no change in sense of smell.  He denies fever or cough or dyspnea.  There is been no nausea, vomiting, diarrhea.  Past Medical History:  Diagnosis Date  . GERD (gastroesophageal reflux disease)   . HTN (hypertension)   . Hyperlipidemia   . 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 OSA (obstructive sleep apnea)   . Vitamin D deficiency     Patient Active Problem List   Diagnosis Date Noted  . Bilateral leg pain 02/23/2018  . OSA (obstructive sleep apnea) 02/25/2011    Past Surgical History:  Procedure Laterality Date  . BACK SURGERY  09/2007   Tumor removed from back  . CHOLECYSTECTOMY N/A 12/01/2015   Procedure: LAPAROSCOPIC CHOLECYSTECTOMY;  Surgeon: Arta Bruce Kinsinger, MD;  Location: [facility];  Service: General;  Laterality: N/A;        Home Medications    Prior to Admission medications   Medication Sig Start Date End Date Taking? Authorizing Provider  amLODipine (NORVASC) 10 MG tablet Take 1 tablet by mouth daily. 05/29/15   [provider]  lisinopril-hydrochlorothiazide (PRINZIDE,ZESTORETIC) 20-25 MG tablet Take 1 tablet by mouth daily. Am 09/24/15   [provider]  potassium chloride (K-DUR) 10 MEQ tablet Take 2 tablets (20 mEq total) by mouth daily. Take 2 tablets ( 20 mEq total) by mouth daily for 3 days total. 03/01/18   Lauraine Rinne, NP    Family History History reviewed. No pertinent family history.  Social History Social History   Tobacco Use  . Smoking status: Never Smoker  . Smokeless tobacco: Never Used  Substance Use Topics  . Alcohol use: Yes    Alcohol/week: 0.0 standard drinks    Comment: occasional wine  . Drug use: No     Allergies   Lisinopril   Review of Systems Review of Systems  All other systems reviewed and are negative.    Physical Exam Updated Vital Signs BP (!) 147/95 (BP Location: Left Arm)   Pulse 78   Temp 99.7 F (37.6 C) (Oral)   Resp (!) 21   Ht 5\' 9"  (1.753 m)   Wt 93 kg   SpO2 99%   BMI 30.27 kg/m   Physical Exam Vitals signs and nursing note reviewed.    58 year old male, resting comfortably and in no acute distress. Vital signs are significant for mildly elevated blood pressure and mildly elevated respiratory rate. Oxygen saturation is 99%, which is normal. Head is normocephalic and atraumatic. PERRLA, EOMI. Oropharynx is clear.  Angioedema present in the upper lip.  No lingual or sublingual swelling.  No edema of the uvula.  No pooling  of secretions.  Phonation is normal.  No stridor. Neck is nontender and supple without adenopathy or JVD. Back is nontender and there is no CVA tenderness. Lungs are clear without rales, wheezes, or rhonchi. Chest is nontender. Heart has regular rate and rhythm without murmur. Abdomen is soft, flat, nontender without masses or hepatosplenomegaly and peristalsis is normoactive. Extremities have no cyanosis or edema, full range of motion is present. Skin is warm and dry without rash. Neurologic: Mental status is normal, cranial nerves are intact, there are no motor or sensory deficits.  ED Treatments / Results   Procedures Procedures  CRITICAL  CARE Performed by: Delora Fuel Total critical care time: 40 minutes Critical care time was exclusive of separately billable procedures and treating other patients. Critical care was necessary to treat or prevent imminent or life-threatening deterioration. Critical care was time spent personally by me on the following activities: development of treatment plan with patient and/or surrogate as well as nursing, discussions with consultants, evaluation of patient's response to treatment, examination of patient, obtaining history from patient or surrogate, ordering and performing treatments and interventions, ordering and review of laboratory studies, ordering and review of radiographic studies, pulse oximetry and re-evaluation of patient's condition.  Medications Ordered in ED Medications  methylPREDNISolone sodium succinate (SOLU-MEDROL) 125 mg/2 mL injection 125 mg (125 mg Intravenous Given 08/12/19 0022)  diphenhydrAMINE (BENADRYL) injection 25 mg (25 mg Intravenous Given 08/12/19 0017)  famotidine (PEPCID) IVPB 20 mg premix (0 mg Intravenous Stopped 08/12/19 0301)     Initial Impression / Assessment and Plan / ED Course  I have reviewed the triage vital signs and the nursing notes.  Angioedema of the upper lip secondary to ACE inhibitor's.  Exposure to COVID-19.  No evidence of respiratory compromise.  He will be given diphenhydramine, famotidine, methylprednisolone and observed in the emergency department.  Old records are reviewed, and he has no relevant past visits.  He was observed in the ED for 4 hours following above-noted treatment.  There has been slight reduction in the swelling of the upper lip.  He is felt to be safe for discharge at this point.  He is advised to discontinue lisinopril-hydrochlorothiazide and is given a new prescription for the hydrochlorothiazide component, also given prescription for prednisone.  Advised to use diphenhydramine as needed.  Follow-up with PCP.  Return  precautions discussed.  James Montgomery was evaluated in Emergency Department on 08/12/2019 for the symptoms described in the history of present illness. He was evaluated in the context of the global COVID-19 pandemic, which necessitated consideration that the patient might be at risk for infection with the SARS-CoV-2 virus that causes COVID-19. Institutional protocols and algorithms that pertain to the evaluation of patients at risk for COVID-19 are in a state of rapid change based on information released by regulatory bodies including the CDC and federal and state organizations. These policies and algorithms were followed during the patient's care in the ED.  Final Clinical Impressions(s) / ED Diagnoses   Final diagnoses:  ACE inhibitor-aggravated angioedema, initial encounter  Elevated blood pressure reading with diagnosis of hypertension    ED Discharge Orders         Ordered    hydrochlorothiazide (HYDRODIURIL) 25 MG tablet  Daily     08/12/19 0417    predniSONE (DELTASONE) 50 MG tablet  Daily     08/12/19 [id number]           Delora Fuel, MD [id number]

## 2019-08-12 MED ORDER — METHYLPREDNISOLONE SODIUM SUCC 125 MG IJ SOLR
125.0000 mg | Freq: Once | INTRAMUSCULAR | Status: AC
  Administered 2019-08-12: 125 mg via INTRAVENOUS
  Filled 2019-08-12: qty 2

## 2019-08-12 MED ORDER — PREDNISONE 50 MG PO TABS: 50.0000 mg | ORAL_TABLET | Freq: Every day | ORAL | 0 refills | Status: DC

## 2019-08-12 MED ORDER — HYDROCHLOROTHIAZIDE 25 MG PO TABS: 25.0000 mg | ORAL_TABLET | Freq: Every day | ORAL | 0 refills | Status: AC

## 2019-08-12 MED ORDER — DIPHENHYDRAMINE HCL 50 MG/ML IJ SOLN
25.0000 mg | Freq: Once | INTRAMUSCULAR | Status: AC
  Administered 2019-08-12: 25 mg via INTRAVENOUS
  Filled 2019-08-12: qty 1

## 2019-08-12 MED ORDER — FAMOTIDINE IN NACL 20-0.9 MG/50ML-% IV SOLN
20.0000 mg | Freq: Once | INTRAVENOUS | Status: AC
  Administered 2019-08-12: 20 mg via INTRAVENOUS
  Filled 2019-08-12: qty 50

## 2019-08-12 MED ORDER — HYDROCHLOROTHIAZIDE 25 MG PO TABS: 25.0000 mg | ORAL_TABLET | Freq: Every day | ORAL | 0 refills | Status: DC

## 2019-08-12 NOTE — Discharge Instructions (Addendum)
Take diphenhydramine as needed.  Stop taking lisinopril-HCTZ - you had a reaction to the lisinopril component.  You are being given a prescription for hydrochlorothiazide, which was 1 component of the blood pressure pill.  You will need to discuss with your primary care provider whether you will need to be on some additional medication for your blood pressure.  Return if the swelling is getting worse.

## 2019-08-12 NOTE — ED Notes (Signed)
Pt. Stated that he felt better. He is able to speak without difficulty. He is able to follow commands and stick out his tongue and move it around. Pt. Appears to be breathing without difficulty and appears comfortable

## 2019-08-12 NOTE — ED Notes (Signed)
Checked Pt's Lip and tongue. Lip appears to be same as when Pt. arrived. Pt. Can stick out tongue, move it around and speak without difficulty.Pt. stated he felt fine and denied any pain or difficulty breathing.

## 2019-08-22 ENCOUNTER — Encounter: Payer: Self-pay | Admitting: Internal Medicine

## 2019-08-22 ENCOUNTER — Other Ambulatory Visit: Payer: Self-pay | Admitting: Internal Medicine

## 2020-05-28 ENCOUNTER — Ambulatory Visit: Payer: BLUE CROSS/BLUE SHIELD | Admitting: Pulmonary Disease

## 2020-06-02 ENCOUNTER — Other Ambulatory Visit: Payer: Self-pay

## 2020-06-02 ENCOUNTER — Encounter: Payer: Self-pay | Admitting: Acute Care

## 2020-06-02 ENCOUNTER — Ambulatory Visit (INDEPENDENT_AMBULATORY_CARE_PROVIDER_SITE_OTHER): Payer: Self-pay | Admitting: Acute Care

## 2020-06-02 VITALS — BP 136/82 | HR 62 | Temp 97.4°F | Ht 69.0 in | Wt 266.4 lb

## 2020-06-02 DIAGNOSIS — G4733 Obstructive sleep apnea (adult) (pediatric): Secondary | ICD-10-CM

## 2020-06-02 DIAGNOSIS — Z9989 Dependence on other enabling machines and devices: Secondary | ICD-10-CM

## 2020-06-02 NOTE — Patient Instructions (Signed)
It is good to see you today. We will order new supplies for your CPAP machine [facility]) >> [employer]ware mask, and all supplies We will inquire through your DME to see if you qualify for a new CPAP device.    Continue on CPAP at bedtime. You appear to be benefiting from the treatment  Goal is to wear for at least 6 hours each night for maximal clinical benefit. Continue to work on weight loss, as the link between excess weight  and sleep apnea is well established.   Remember to establish a good bedtime routine, and work on sleep hygiene.  Limit daytime naps , avoid stimulants such as caffeine and nicotine close to bedtime, exercise daily to promote sleep quality, avoid heavy , spicy, fried , or rich foods before bed. Ensure adequate exposure to natural light during the day,establish a relaxing bedtime routine with a pleasant sleep environment ( Bedroom between 60 and 67 degrees, turn off bright lights , TV or device screens screens , consider black out curtains or white noise machines) Do not drive if sleepy. Remember to clean mask, tubing, filter, and reservoir once weekly with soapy water.  Follow up with Dr. Halford Chessman   In 3 months  or before as needed.  Please contact office for sooner follow up if symptoms do not improve or worsen or seek emergency care

## 2020-06-02 NOTE — Progress Notes (Addendum)
History of Present Illness James Montgomery is a  59 y.o. male never smoker with obesity and  OSA on CPAP. He is followed by Dr. Halford Chessman.   06/02/2020  Pt presents for follow up of OSA on CPAP. He is compliant with therapy. His down load confirms 100% usage the last month. He is wearing his CPAP > 4 hours each night. He states he  does need new supplies. We will order those. Pt is interested in a new machine.We will place an order to see if his DME will replace his device. I think he will need to wait another year for this however. He denies any daytime sleepiness. He states he feels his sleep is better on the device. He denies any chest pain, orthopnea or hemoptysis. No morning headaches.    Test Results: Down Load 04/30/2020-05/29/2020 Usage 30/30 days > 4 hours 97% < 4 hours 3% Average usage 6 hours 53 minutes CPAP mode Set pressure of 13 cm H2O AHI 0.3  03/24/2015 Home Sleep Study AHI of 18.5, Sao2 Nadir of 78% 01/09/2018:  CPAP Titration  IMPRESSIONS   - The optimal PAP pressure was 13 cm of water.  DIAGNOSIS   - Obstructive Sleep Apnea (327.23 [G47.33 ICD-10])  RECOMMENDATIONS   - Trial of CPAP therapy on 13 cm H2O with a Small size Resmed Nasal CPAP Mask AirFit N30i mask and heated humidification.  CBC Latest Ref Rng & Units 11/24/2015  WBC 4.0 - 10.5 K/uL 6.4  Hemoglobin 13.0 - 17.0 g/dL 13.5  Hematocrit 39 - 52 % 40.1  Platelets 150 - 400 K/uL 280    BMP Latest Ref Rng & Units 02/23/2018 11/24/2015  Glucose 70 - 99 mg/dL 119(H) 84  BUN 6 - 23 mg/dL 12 10  Creatinine 0.40 - 1.50 mg/dL 1.20 1.06  Sodium 135 - 145 mEq/L 139 142  Potassium 3.5 - 5.1 mEq/L 3.1(L) 3.3(L)  Chloride 96 - 112 mEq/L 100 104  CO2 19 - 32 mEq/L 30 27  Calcium 8.4 - 10.5 mg/dL 9.2 9.5    BNP No results found for: BNP  ProBNP No results found for: PROBNP  PFT No results found for: FEV1PRE, FEV1POST, FVCPRE, FVCPOST, TLC, DLCOUNC, PREFEV1FVCRT, PSTFEV1FVCRT  No results found.   Past  medical hx Past Medical History:  Diagnosis Date   GERD (gastroesophageal reflux disease)    HTN (hypertension)    Hyperlipidemia    Myeloid sarcoma ([facility])    OSA (obstructive sleep apnea)    Vitamin D deficiency      Social History   Tobacco Use   Smoking status: Never Smoker   Smokeless tobacco: Never Used  Substance Use Topics   Alcohol use: Yes    Alcohol/week: 0.0 standard drinks    Comment: occasional wine   Drug use: No    James Montgomery reports that he has never smoked. He has never used smokeless tobacco. He reports current alcohol use. He reports that he does not use drugs.  Tobacco Cessation: Never smoker  Past surgical hx, Family hx, Social hx all reviewed.  Current Outpatient Medications on File Prior to Visit  Medication Sig   amLODipine-olmesartan (AZOR) 10-40 MG tablet Take 1 tablet by mouth daily.   Cholecalciferol (VITAMIN D-1000 MAX ST) 25 MCG (1000 UT) tablet Take 1 tablet by mouth daily.   hydrochlorothiazide (HYDRODIURIL) 25 MG tablet Take 1 tablet (25 mg total) by mouth daily.   Multiple Vitamin (MULTI-VITAMIN) tablet Take 1 tablet by mouth daily.   pantoprazole (PROTONIX) 40  MG tablet Take 1 tablet by mouth daily.   potassium chloride (K-DUR) 10 MEQ tablet Take 2 tablets (20 mEq total) by mouth daily. Take 2 tablets ( 20 mEq total) by mouth daily for 3 days total.   predniSONE (DELTASONE) 50 MG tablet Take 1 tablet (50 mg total) by mouth daily.   vitamin B-12 (CYANOCOBALAMIN) 1000 MCG tablet Take 1 tablet by mouth once a week.   [DISCONTINUED] lisinopril-hydrochlorothiazide (PRINZIDE,ZESTORETIC) 20-25 MG tablet Take 1 tablet by mouth daily. Am   No current facility-administered medications on file prior to visit.     Allergies  Allergen Reactions   Lisinopril Swelling    Review Of Systems:  Constitutional:   No  weight loss, night sweats,  Fevers, chills, fatigue, or  lassitude.  HEENT:   No headaches,  Difficulty swallowing,   Tooth/dental problems, or  Sore throat,                No sneezing, itching, ear ache, nasal congestion, post nasal drip,   CV:  No chest pain,  Orthopnea, PND, swelling in lower extremities, anasarca, dizziness, palpitations, syncope.   GI  No heartburn, indigestion, abdominal pain, nausea, vomiting, diarrhea, change in bowel habits, loss of appetite, bloody stools.   Resp: No shortness of breath with exertion or at rest.  No excess mucus, no productive cough,  No non-productive cough,  No coughing up of blood.  No change in color of mucus.  No wheezing.  No chest wall deformity  Skin: no rash or lesions.  GU: no dysuria, change in color of urine, no urgency or frequency.  No flank pain, no hematuria   MS:  No joint pain or swelling.  No decreased range of motion.  No back pain.  Psych:  No change in mood or affect. No depression or anxiety.  No memory loss.   Vital Signs BP 136/82 (BP Location: Left Arm, Cuff Size: Normal)    Pulse 62    Temp (!) 97.4 F (36.3 C) (Oral)    Ht 5\' 9"  (1.753 m)    Wt 266 lb 6.4 oz (120.8 kg)    SpO2 95%    BMI 39.34 kg/m    Physical Exam:  General- No distress,  A&Ox3, pleasant ENT: No sinus tenderness, TM clear, pale nasal mucosa, no oral exudate,no post nasal drip, no LAN Cardiac: S1, S2, regular rate and rhythm, no murmur Chest: No wheeze/ rales/ dullness; no accessory muscle use, no nasal flaring, no sternal retractions Abd.: Soft Non-tender, ND. Body mass index is 39.34 kg/m. Ext: No clubbing cyanosis, edema Neuro:  normal strength, MAE x 4, A&O x 3 Skin: No rashes, warm and dry Psych: normal mood and behavior   Assessment/Plan  OSA on CPAP Excellent compliance AHI of 0.3 Plan We will order new supplies for your CPAP machine ( [facility] [facility]) >> Dreamware mask, and all supplies We will inquire through your DME to see if you qualify for a new CPAP device.    Continue on CPAP at bedtime. You appear to be benefiting from the treatment    Goal is to wear for at least 6 hours each night for maximal clinical benefit. Continue to work on weight loss, as the link between excess weight  and sleep apnea is well established.   Remember to establish a good bedtime routine, and work on sleep hygiene.  Limit daytime naps , avoid stimulants such as caffeine and nicotine close to bedtime, exercise daily to promote sleep quality, avoid  heavy , spicy, fried , or rich foods before bed. Ensure adequate exposure to natural light during the day,establish a relaxing bedtime routine with a pleasant sleep environment ( Bedroom between 60 and 67 degrees, turn off bright lights , TV or device screens screens , consider black out curtains or white noise machines) Do not drive if sleepy. Remember to clean mask, tubing, filter, and reservoir once weekly with soapy water.  Follow up with Dr. Halford Chessman or Judson Roch NP   In 3 months  or before as needed.  Please contact office for sooner follow up if symptoms do not improve or worsen or seek emergency care      Magdalen Spatz, NP 06/02/2020  10:49 AM

## 2020-06-02 NOTE — Progress Notes (Signed)
Reviewed and agree with assessment/plan.   Chesley Mires, MD [facility] 06/02/2020, 4:16 PM Pager:  [phone]

## 2020-09-19 ENCOUNTER — Ambulatory Visit: Payer: BC Managed Care – PPO | Admitting: Pulmonary Disease

## 2023-08-17 ENCOUNTER — Ambulatory Visit: Payer: BC Managed Care – PPO | Admitting: Pulmonary Disease

## 2023-08-17 ENCOUNTER — Encounter: Payer: Self-pay | Admitting: Pulmonary Disease

## 2023-08-17 VITALS — BP 160/90 | HR 61 | Temp 97.9°F | Ht 70.0 in | Wt 276.0 lb

## 2023-08-17 DIAGNOSIS — Z23 Encounter for immunization: Secondary | ICD-10-CM | POA: Diagnosis not present

## 2023-08-17 DIAGNOSIS — G4733 Obstructive sleep apnea (adult) (pediatric): Secondary | ICD-10-CM

## 2023-08-17 NOTE — Addendum Note (Signed)
Addended by: Christen Butter on: 08/17/2023 03:34 PM   Modules accepted: Orders

## 2023-08-17 NOTE — Progress Notes (Signed)
Synopsis: Hx of OSA  Subjective:   PATIENT ID: James Montgomery GENDER: male DOB: February 08, 1961, MRN: [id number]  HPI  Chief Complaint  Patient presents with   Sleep Consult    OSA- on CPAP and needs new machine and supplies- [employer]. He states has felt tired during the day over the past few days.    James Montgomery is a 62 year old male, never smoker with history of hypertension, GERD and OSA on CPAP.   He returns today for follow up of his OSA. He was last seen in 2021. His compliance report shows 30/30 days used, with all 30 days > 4 hours of usage. AHI is 0.6. He is on CPAP setting 13cmH2O. He is using nasal mask. Only complaint he has is dry mouth/throat in the morning time. He is using machine with humidification.   Past Medical History:  Diagnosis Date   GERD (gastroesophageal reflux disease)    HTN (hypertension)    Hyperlipidemia    Myeloid sarcoma ([facility])    OSA (obstructive sleep apnea)    Vitamin D deficiency      History reviewed. No pertinent family history.   Social History   Socioeconomic History   Marital status: Married    Spouse name: Not on file   Number of children: Not on file   Years of education: Not on file   Highest education level: Not on file  Occupational History   Occupation: Insurance  Tobacco Use   Smoking status: Never   Smokeless tobacco: Never  Substance and Sexual Activity   Alcohol use: Yes    Alcohol/week: 0.0 standard drinks of alcohol    Comment: occasional wine   Drug use: No   Sexual activity: Not on file  Other Topics Concern   Not on file  Social History Narrative   Not on file   Social Determinants of Health   Financial Resource Strain: Not on file  Food Insecurity: Not on file  Transportation Needs: Not on file  Physical Activity: Not on file  Stress: Not on file  Social Connections: Not on file  Intimate Partner Violence: Not on file     Allergies  Allergen Reactions   Lisinopril Swelling     Outpatient Medications  Prior to Visit  Medication Sig Dispense Refill   amLODipine-olmesartan (AZOR) 10-40 MG tablet Take 1 tablet by mouth daily.     Cholecalciferol (VITAMIN D-1000 MAX ST) 25 MCG (1000 UT) tablet Take 1 tablet by mouth daily.     hydrochlorothiazide (HYDRODIURIL) 25 MG tablet Take 1 tablet (25 mg total) by mouth daily. 30 tablet 0   Multiple Vitamin (MULTI-VITAMIN) tablet Take 1 tablet by mouth daily.     pantoprazole (PROTONIX) 40 MG tablet Take 1 tablet by mouth daily.     potassium chloride (K-DUR) 10 MEQ tablet Take 2 tablets (20 mEq total) by mouth daily. Take 2 tablets ( 20 mEq total) by mouth daily for 3 days total. 6 tablet 0   vitamin B-12 (CYANOCOBALAMIN) 1000 MCG tablet Take 1 tablet by mouth once a week.     predniSONE (DELTASONE) 50 MG tablet Take 1 tablet (50 mg total) by mouth daily. 5 tablet 0   No facility-administered medications prior to visit.   Review of Systems  Constitutional:  Negative for chills, fever, malaise/fatigue and weight loss.  HENT:  Negative for congestion, sinus pain and sore throat.   Eyes: Negative.   Respiratory:  Negative for cough, hemoptysis, sputum production, shortness of breath  and wheezing.   Cardiovascular:  Negative for chest pain, palpitations, orthopnea, claudication and leg swelling.  Gastrointestinal:  Negative for abdominal pain, heartburn, nausea and vomiting.  Genitourinary: Negative.   Musculoskeletal:  Negative for joint pain and myalgias.  Skin:  Negative for rash.  Neurological:  Negative for weakness.  Endo/Heme/Allergies: Negative.   Psychiatric/Behavioral: Negative.     Objective:   Vitals:   08/17/23 1346  BP: (!) 160/90  Pulse: 61  Temp: 97.9 F (36.6 C)  TempSrc: Temporal  SpO2: 96%  Weight: 276 lb (125.2 kg)  Height: 5\' 10"  (1.778 m)   Physical Exam Constitutional:      General: He is not in acute distress.    Appearance: Normal appearance.  Eyes:     General: No scleral icterus.    Conjunctiva/sclera:  Conjunctivae normal.  Cardiovascular:     Rate and Rhythm: Normal rate and regular rhythm.  Pulmonary:     Breath sounds: No wheezing, rhonchi or rales.  Musculoskeletal:     Right lower leg: No edema.     Left lower leg: No edema.  Skin:    General: Skin is warm and dry.  Neurological:     General: No focal deficit present.    CBC    Component Value Date/Time   WBC 6.4 11/24/2015 1440   RBC 4.80 11/24/2015 1440   HGB 13.5 11/24/2015 1440   HCT 40.1 11/24/2015 1440   PLT 280 11/24/2015 1440   MCV 83.5 11/24/2015 1440   MCH 28.1 11/24/2015 1440   MCHC 33.7 11/24/2015 1440   RDW 13.4 11/24/2015 1440     Chest imaging:  PFT:     No data to display          Sleep Studies 03/24/2015 Home Sleep Study AHI of 18.5, Sao2 Nadir of 78% 01/09/2018:   CPAP Titration IMPRESSIONS  - The optimal PAP pressure was 13 cm of water. DIAGNOSIS  - Obstructive Sleep Apnea (327.23 [G47.33 ICD-10]) RECOMMENDATIONS  - Trial of CPAP therapy on 13 cm H2O with a Small size Resmed Nasal CPAP Mask AirFit N30i mask and heated humidification.       Assessment & Plan:   Obstructive sleep apnea syndrome - Plan: Ambulatory Referral for DME  Discussion: [name] is a 62 year old male, never smoker with history of hypertension, GERD and OSA on CPAP.   Continue cpap 13cmH2O nightly with nasal mask. He is showing good compliance and control of AHI on current settings. Orders sent to DME for new machine and supplies. Consider chin strap if dry mouth symptoms continue. Consider biotene mouth rinse prior to bedtime for dry mouth symptoms.   Follow up in 1 year.  Melody Comas, MD [facility] Office: [phone]    Current Outpatient Medications:    amLODipine-olmesartan (AZOR) 10-40 MG tablet, Take 1 tablet by mouth daily., Disp: , Rfl:    Cholecalciferol (VITAMIN D-1000 MAX ST) 25 MCG (1000 UT) tablet, Take 1 tablet by mouth daily., Disp: , Rfl:     hydrochlorothiazide (HYDRODIURIL) 25 MG tablet, Take 1 tablet (25 mg total) by mouth daily., Disp: 30 tablet, Rfl: 0   Multiple Vitamin (MULTI-VITAMIN) tablet, Take 1 tablet by mouth daily., Disp: , Rfl:    pantoprazole (PROTONIX) 40 MG tablet, Take 1 tablet by mouth daily., Disp: , Rfl:    potassium chloride (K-DUR) 10 MEQ tablet, Take 2 tablets (20 mEq total) by mouth daily. Take 2 tablets ( 20 mEq total) by mouth  daily for 3 days total., Disp: 6 tablet, Rfl: 0   vitamin B-12 (CYANOCOBALAMIN) 1000 MCG tablet, Take 1 tablet by mouth once a week., Disp: , Rfl:

## 2023-08-17 NOTE — Patient Instructions (Signed)
Continue CPAP therapy at 13cmH2O   We will order you a new machine and supplies including mask of your choice  If your mouth continues to be dry after the new machine/mask arrive, consider a chin strap to keep your mouth closed while sleeping or consider using biotene mouthwash before bed  Follow up in 1 year, call sooner if needed

## 2023-11-28 ENCOUNTER — Ambulatory Visit: Payer: BC Managed Care – PPO | Admitting: Skilled Nursing Facility1
